# Patient Record
Sex: Male | Born: 1980 | Race: White | Hispanic: No | Marital: Single
Health system: Southern US, Community
[De-identification: ages and names within clinical notes are randomized; demographics above are authoritative.]

## PROBLEM LIST (undated history)

## (undated) DIAGNOSIS — M545 Low back pain, unspecified: Secondary | ICD-10-CM

## (undated) DIAGNOSIS — R519 Headache, unspecified: Secondary | ICD-10-CM

## (undated) DIAGNOSIS — R51 Headache: Secondary | ICD-10-CM

## (undated) HISTORY — DX: Morbid (severe) obesity due to excess calories: E66.01

---

## 2002-08-05 ENCOUNTER — Emergency Department (HOSPITAL_COMMUNITY): Admission: EM | Admit: 2002-08-05 | Discharge: 2002-08-05 | Payer: Self-pay | Admitting: Emergency Medicine

## 2002-08-09 ENCOUNTER — Encounter: Payer: Self-pay | Admitting: *Deleted

## 2002-08-09 ENCOUNTER — Emergency Department (HOSPITAL_COMMUNITY): Admission: EM | Admit: 2002-08-09 | Discharge: 2002-08-09 | Payer: Self-pay | Admitting: *Deleted

## 2007-08-08 ENCOUNTER — Emergency Department (HOSPITAL_COMMUNITY): Admission: EM | Admit: 2007-08-08 | Discharge: 2007-08-08 | Payer: Self-pay | Admitting: Emergency Medicine

## 2007-11-17 ENCOUNTER — Emergency Department (HOSPITAL_COMMUNITY): Admission: EM | Admit: 2007-11-17 | Discharge: 2007-11-17 | Payer: Self-pay | Admitting: Emergency Medicine

## 2007-11-27 ENCOUNTER — Emergency Department (HOSPITAL_COMMUNITY): Admission: EM | Admit: 2007-11-27 | Discharge: 2007-11-27 | Payer: Self-pay | Admitting: Emergency Medicine

## 2008-08-21 ENCOUNTER — Emergency Department (HOSPITAL_COMMUNITY): Admission: EM | Admit: 2008-08-21 | Discharge: 2008-08-22 | Payer: Self-pay | Admitting: Emergency Medicine

## 2011-03-21 ENCOUNTER — Emergency Department (HOSPITAL_COMMUNITY)
Admission: EM | Admit: 2011-03-21 | Discharge: 2011-03-22 | Disposition: A | Discharge status: Home / Self Care | Payer: Medicaid Other | Attending: Emergency Medicine | Admitting: Emergency Medicine

## 2011-03-21 DIAGNOSIS — Y929 Unspecified place or not applicable: Secondary | ICD-10-CM | POA: Insufficient documentation

## 2011-03-21 DIAGNOSIS — T63391A Toxic effect of venom of other spider, accidental (unintentional), initial encounter: Secondary | ICD-10-CM | POA: Insufficient documentation

## 2011-03-21 DIAGNOSIS — T6391XA Toxic effect of contact with unspecified venomous animal, accidental (unintentional), initial encounter: Secondary | ICD-10-CM | POA: Insufficient documentation

## 2011-03-21 DIAGNOSIS — M7989 Other specified soft tissue disorders: Secondary | ICD-10-CM | POA: Insufficient documentation

## 2011-03-21 DIAGNOSIS — M79609 Pain in unspecified limb: Secondary | ICD-10-CM | POA: Insufficient documentation

## 2011-12-02 ENCOUNTER — Emergency Department (HOSPITAL_COMMUNITY): Payer: Medicaid Other

## 2011-12-02 ENCOUNTER — Encounter: Payer: Self-pay | Admitting: Emergency Medicine

## 2011-12-02 ENCOUNTER — Emergency Department (HOSPITAL_COMMUNITY)
Admission: EM | Admit: 2011-12-02 | Discharge: 2011-12-02 | Disposition: A | Discharge status: Home / Self Care | Payer: Medicaid Other | Attending: Emergency Medicine | Admitting: Emergency Medicine

## 2011-12-02 DIAGNOSIS — W3189XA Contact with other specified machinery, initial encounter: Secondary | ICD-10-CM | POA: Insufficient documentation

## 2011-12-02 DIAGNOSIS — S81009A Unspecified open wound, unspecified knee, initial encounter: Secondary | ICD-10-CM | POA: Insufficient documentation

## 2011-12-02 DIAGNOSIS — S81011A Laceration without foreign body, right knee, initial encounter: Secondary | ICD-10-CM

## 2011-12-02 DIAGNOSIS — S81809A Unspecified open wound, unspecified lower leg, initial encounter: Secondary | ICD-10-CM | POA: Insufficient documentation

## 2011-12-02 DIAGNOSIS — F17200 Nicotine dependence, unspecified, uncomplicated: Secondary | ICD-10-CM | POA: Insufficient documentation

## 2011-12-02 MED ORDER — TETANUS-DIPHTH-ACELL PERTUSSIS 5-2.5-18.5 LF-MCG/0.5 IM SUSP
0.5000 mL | Freq: Once | INTRAMUSCULAR | Status: AC
  Administered 2011-12-02: 0.5 mL via INTRAMUSCULAR
  Filled 2011-12-02: qty 0.5

## 2011-12-02 MED ORDER — HYDROMORPHONE HCL PF 2 MG/ML IJ SOLN
2.0000 mg | Freq: Once | INTRAMUSCULAR | Status: AC
  Administered 2011-12-02: 2 mg via INTRAMUSCULAR
  Filled 2011-12-02: qty 1

## 2011-12-02 MED ORDER — IBUPROFEN 800 MG PO TABS: 800.0000 mg | ORAL_TABLET | Freq: Three times a day (TID) | ORAL | Status: AC

## 2011-12-02 MED ORDER — BACITRACIN 500 UNIT/GM EX OINT
1.0000 "application " | TOPICAL_OINTMENT | CUTANEOUS | Status: AC
  Administered 2011-12-02: 1 via TOPICAL

## 2011-12-02 NOTE — ED Notes (Signed)
Pt st's saw kicked back hitting his leg.  Pt has lac to right leg   Bleeding controlled at this time

## 2011-12-02 NOTE — ED Provider Notes (Signed)
History     CSN: [id number] Arrival date & time: 12/02/2011  4:15 PM   First MD Initiated Contact with Patient 12/02/11 1652      Chief Complaint  Patient presents with  . Extremity Laceration    (Consider location/radiation/quality/duration/timing/severity/associated sxs/prior treatment) HPI Comments: Patient reports he was using an electric saw to cut stone when the saw slipped and cut his right knee.  Reports pain at the site of the laceration that is moderate and aching in nature.  Denies other injury.  Denies numbness of his leg or difficulty ambulating or moving the knee.  Last tetanus vx is unknown.    The history is provided by the patient.    History reviewed. No pertinent past medical history.  History reviewed. No pertinent past surgical history.  No family history on file.  History  Substance Use Topics  . Smoking status: Current Everyday Smoker  . Smokeless tobacco: Not on file  . Alcohol Use: No      Review of Systems  Allergies  Review of patient's allergies indicates no known allergies.  Home Medications  No current outpatient prescriptions on file.  BP 113/71  Pulse 80  Temp(Src) 98.2 F (36.8 C) (Oral)  Resp 20  SpO2 98%  Physical Exam  Nursing note and vitals reviewed. Constitutional: He is oriented to person, place, and time. He appears well-developed and well-nourished.  HENT:  Head: Normocephalic and atraumatic.  Neck: Neck supple.  Pulmonary/Chest: Effort normal.  Musculoskeletal: Normal range of motion.       Full AROM of right knee.  Deep laceration to superior, medial right knee.  No FB seen or palpated.  Distal pulses intact.  Sensation intact.    Neurological: He is alert and oriented to person, place, and time.    ED Course  Procedures (including critical care time)  LACERATION REPAIR Performed by: Rise Patience Consent: Verbal consent obtained. Risks and benefits: risks, benefits and alternatives were discussed Patient  identity confirmed: provided demographic data Time out performed prior to procedure Prepped and Draped in normal sterile fashion Wound explored  Laceration Location: right knee  Laceration Length: 7cm  No Foreign Bodies seen or palpated  Anesthesia: local infiltration  Local anesthetic: lidocaine 2% no epinephrine  Anesthetic total: 8 ml  Irrigation method: syringe Amount of cleaning: standard  Skin closure: chromic 4-0 and prolene 4-0  Number of sutures or staples: 12  Technique: deep running suture (1) and simple interrupted over skin (11)  Patient tolerance: Patient tolerated the procedure well with no immediate complications.   Labs Reviewed - No data to display Dg Knee 2 Views Right  12/02/2011  *RADIOLOGY REPORT*  Clinical Data: Laceration by electric saw, evaluate for foreign body  RIGHT KNEE - 1-2 VIEW  Comparison: None.  Findings: No fracture or dislocation is seen.  The joint spaces are preserved.  Soft tissue laceration along the medial aspect of the knee.  No radiopaque foreign body is seen.  IMPRESSION: Soft tissue laceration along the medial aspect of the knee.  No fracture, dislocation, or radiopaque foreign body is seen.  Original Report Authenticated By: Charline Bills, M.D.     1. Laceration of right knee       MDM  Patient with laceration of right knee with normal xray, no tendon damage, no FB.  Closed in ED.  Instructions given for follow up and reasons for immediate return.  Patient verbalizes understanding.          Rise Patience,  PA 12/02/11 2029

## 2011-12-03 NOTE — ED Provider Notes (Signed)
Medical screening examination/treatment/procedure(s) were performed by non-physician practitioner and as supervising physician I was immediately available for consultation/collaboration.  Haydyn Girvan T Brayant Dorr, MD 12/03/11 1603 

## 2012-12-27 ENCOUNTER — Emergency Department (HOSPITAL_COMMUNITY)
Admission: EM | Admit: 2012-12-27 | Discharge: 2012-12-28 | Disposition: A | Discharge status: Home / Self Care | Payer: Medicaid Other | Attending: Emergency Medicine | Admitting: Emergency Medicine

## 2012-12-27 ENCOUNTER — Encounter (HOSPITAL_COMMUNITY): Payer: Self-pay | Admitting: Emergency Medicine

## 2012-12-27 DIAGNOSIS — Y929 Unspecified place or not applicable: Secondary | ICD-10-CM | POA: Insufficient documentation

## 2012-12-27 DIAGNOSIS — F17200 Nicotine dependence, unspecified, uncomplicated: Secondary | ICD-10-CM | POA: Insufficient documentation

## 2012-12-27 DIAGNOSIS — Y9389 Activity, other specified: Secondary | ICD-10-CM | POA: Insufficient documentation

## 2012-12-27 DIAGNOSIS — IMO0002 Reserved for concepts with insufficient information to code with codable children: Secondary | ICD-10-CM | POA: Insufficient documentation

## 2012-12-27 DIAGNOSIS — M545 Low back pain: Secondary | ICD-10-CM

## 2012-12-27 DIAGNOSIS — X500XXA Overexertion from strenuous movement or load, initial encounter: Secondary | ICD-10-CM | POA: Insufficient documentation

## 2012-12-27 DIAGNOSIS — T148XXA Other injury of unspecified body region, initial encounter: Secondary | ICD-10-CM

## 2012-12-27 NOTE — ED Notes (Signed)
Patient c/o lower back pain since 1630.  States was picking up a bucket of rock at work and thinks he may have twisted wrong.

## 2012-12-28 MED ORDER — KETOROLAC TROMETHAMINE 10 MG PO TABS
10.0000 mg | ORAL_TABLET | Freq: Once | ORAL | Status: AC
  Administered 2012-12-28: 10 mg via ORAL
  Filled 2012-12-28: qty 1

## 2012-12-28 MED ORDER — HYDROMORPHONE HCL PF 1 MG/ML IJ SOLN
1.0000 mg | Freq: Once | INTRAMUSCULAR | Status: AC
  Administered 2012-12-28: 1 mg via INTRAMUSCULAR
  Filled 2012-12-28: qty 1

## 2012-12-28 MED ORDER — DIAZEPAM 5 MG PO TABS
5.0000 mg | ORAL_TABLET | Freq: Once | ORAL | Status: AC
  Administered 2012-12-28: 5 mg via ORAL
  Filled 2012-12-28: qty 1

## 2012-12-28 MED ORDER — ONDANSETRON 4 MG PO TBDP
4.0000 mg | ORAL_TABLET | Freq: Once | ORAL | Status: AC
  Administered 2012-12-28: 4 mg via ORAL
  Filled 2012-12-28: qty 1

## 2012-12-28 MED ORDER — HYDROCODONE-ACETAMINOPHEN 7.5-325 MG PO TABS: 1.0000 | ORAL_TABLET | ORAL | Status: AC | PRN

## 2012-12-28 MED ORDER — MELOXICAM 7.5 MG PO TABS: ORAL_TABLET | ORAL | Status: DC

## 2012-12-28 MED ORDER — METHOCARBAMOL 500 MG PO TABS: ORAL_TABLET | ORAL | Status: DC

## 2012-12-28 NOTE — ED Provider Notes (Signed)
Medical screening examination/treatment/procedure(s) were performed by non-physician practitioner and as supervising physician I was immediately available for consultation/collaboration.  Nicoletta Dress. Colon Branch, MD 12/28/12 [phone]

## 2012-12-28 NOTE — ED Provider Notes (Signed)
History     CSN: [id number]  Arrival date & time 12/27/12  2310   First MD Initiated Contact with Patient 12/27/12 2318      Chief Complaint  Patient presents with  . Back Pain    (Consider location/radiation/quality/duration/timing/severity/associated sxs/prior treatment) Patient is a 32 y.o. male presenting with back pain. The history is provided by the patient.  Back Pain  This is a new problem. The current episode started 6 to 12 hours ago. The problem occurs constantly. The problem has been gradually worsening. The pain is associated with lifting heavy objects and twisting. The pain is present in the lumbar spine. The quality of the pain is described as aching (back spasms). The pain is at a severity of 10/10. The symptoms are aggravated by twisting and certain positions. The pain is the same all the time. Pertinent negatives include no chest pain, no fever, no numbness, no abdominal pain, no bowel incontinence, no perianal numbness, no bladder incontinence and no dysuria. He has tried nothing for the symptoms.    History reviewed. No pertinent past medical history.  History reviewed. No pertinent past surgical history.  No family history on file.  History  Substance Use Topics  . Smoking status: Current Every Day Smoker  . Smokeless tobacco: Not on file  . Alcohol Use: Yes     Comment: occ      Review of Systems  Constitutional: Negative for fever and activity change.       All ROS Neg except as noted in HPI  HENT: Negative for nosebleeds and neck pain.   Eyes: Negative for photophobia and discharge.  Respiratory: Positive for cough. Negative for shortness of breath and wheezing.   Cardiovascular: Negative for chest pain and palpitations.  Gastrointestinal: Negative for abdominal pain, blood in stool and bowel incontinence.  Genitourinary: Negative for bladder incontinence, dysuria, frequency and hematuria.  Musculoskeletal: Positive for back pain. Negative for  arthralgias.  Skin: Negative.   Neurological: Negative for dizziness, seizures, speech difficulty and numbness.  Psychiatric/Behavioral: Negative for hallucinations and confusion.    Allergies  Review of patient's allergies indicates no known allergies.  Home Medications  No current outpatient prescriptions on file.  BP 122/76  Pulse 70  Temp 97.5 F (36.4 C) (Oral)  Resp 18  Ht 6' (1.829 m)  Wt 290 lb (131.543 kg)  BMI 39.33 kg/m2  SpO2 99%  Physical Exam  Nursing note and vitals reviewed. Constitutional: He is oriented to person, place, and time. He appears well-developed and well-nourished.  Non-toxic appearance.  HENT:  Head: Normocephalic.  Right Ear: Tympanic membrane and external ear normal.  Left Ear: Tympanic membrane and external ear normal.  Eyes: EOM and lids are normal. Pupils are equal, round, and reactive to light.  Neck: Normal range of motion. Neck supple. Carotid bruit is not present.  Cardiovascular: Normal rate, regular rhythm, normal heart sounds, intact distal pulses and normal pulses.   Pulmonary/Chest: Breath sounds normal. No respiratory distress.       Few scattered rhonchi and wheezes.  Abdominal: Soft. Bowel sounds are normal. There is no tenderness. There is no guarding.  Musculoskeletal: Normal range of motion.       Right and left paraspinal pain/spasm in the lumbar area to palpation and change of position.  Lymphadenopathy:       Head (right side): No submandibular adenopathy present.       Head (left side): No submandibular adenopathy present.    He has no cervical  adenopathy.  Neurological: He is alert and oriented to person, place, and time. He has normal strength. No cranial nerve deficit or sensory deficit. He exhibits normal muscle tone. Coordination normal.  Skin: Skin is warm and dry.  Psychiatric: He has a normal mood and affect. His speech is normal.    ED Course  Procedures (including critical care time)  Labs Reviewed - No  data to display No results found. Pulse Ox 99% on room air. WNL by my interpretation.  No diagnosis found.    MDM  I have reviewed nursing notes, vital signs, and all appropriate lab and imaging results for this patient. Pt was lifting bucks of concrete and rocks today, twisted  And lifted wrong and has had pain of the lower back since that time. No gross neuro deficit. Rx for norco 7.5, robaxin, and mobic given to the patient. He is to use heat to the lower back and see Dr Sherlean Foot for evaluation if not improving.       Kathie Dike, [place] 12/28/12 [phone]

## 2013-04-06 ENCOUNTER — Emergency Department (HOSPITAL_COMMUNITY)
Admission: EM | Admit: 2013-04-06 | Discharge: 2013-04-06 | Disposition: A | Discharge status: Home / Self Care | Payer: Medicaid Other | Attending: Emergency Medicine | Admitting: Emergency Medicine

## 2013-04-06 ENCOUNTER — Encounter (HOSPITAL_COMMUNITY): Payer: Self-pay | Admitting: *Deleted

## 2013-04-06 DIAGNOSIS — X503XXA Overexertion from repetitive movements, initial encounter: Secondary | ICD-10-CM | POA: Insufficient documentation

## 2013-04-06 DIAGNOSIS — F17200 Nicotine dependence, unspecified, uncomplicated: Secondary | ICD-10-CM | POA: Insufficient documentation

## 2013-04-06 DIAGNOSIS — M62830 Muscle spasm of back: Secondary | ICD-10-CM

## 2013-04-06 DIAGNOSIS — M5380 Other specified dorsopathies, site unspecified: Secondary | ICD-10-CM | POA: Insufficient documentation

## 2013-04-06 DIAGNOSIS — Y9389 Activity, other specified: Secondary | ICD-10-CM | POA: Insufficient documentation

## 2013-04-06 DIAGNOSIS — M545 Low back pain: Secondary | ICD-10-CM

## 2013-04-06 DIAGNOSIS — Y990 Civilian activity done for income or pay: Secondary | ICD-10-CM | POA: Insufficient documentation

## 2013-04-06 DIAGNOSIS — IMO0002 Reserved for concepts with insufficient information to code with codable children: Secondary | ICD-10-CM | POA: Insufficient documentation

## 2013-04-06 DIAGNOSIS — Y9289 Other specified places as the place of occurrence of the external cause: Secondary | ICD-10-CM | POA: Insufficient documentation

## 2013-04-06 MED ORDER — DIAZEPAM 5 MG/ML IJ SOLN
10.0000 mg | Freq: Once | INTRAMUSCULAR | Status: AC
  Administered 2013-04-06: 10 mg via INTRAVENOUS
  Filled 2013-04-06: qty 2

## 2013-04-06 MED ORDER — DEXAMETHASONE SODIUM PHOSPHATE 4 MG/ML IJ SOLN
10.0000 mg | Freq: Once | INTRAMUSCULAR | Status: AC
  Administered 2013-04-06: 10 mg via INTRAVENOUS
  Filled 2013-04-06: qty 1

## 2013-04-06 MED ORDER — MORPHINE SULFATE 4 MG/ML IJ SOLN
4.0000 mg | Freq: Once | INTRAMUSCULAR | Status: AC
  Administered 2013-04-06: 4 mg via INTRAMUSCULAR
  Filled 2013-04-06: qty 1

## 2013-04-06 MED ORDER — IBUPROFEN 600 MG PO TABS: 600.0000 mg | ORAL_TABLET | Freq: Four times a day (QID) | ORAL | Status: DC | PRN

## 2013-04-06 MED ORDER — CYCLOBENZAPRINE HCL 5 MG PO TABS: 5.0000 mg | ORAL_TABLET | Freq: Three times a day (TID) | ORAL | Status: DC | PRN

## 2013-04-06 MED ORDER — TRAMADOL HCL 50 MG PO TABS: 100.0000 mg | ORAL_TABLET | Freq: Four times a day (QID) | ORAL | Status: DC | PRN

## 2013-04-06 NOTE — ED Provider Notes (Signed)
History    This chart was scribed for Ward Givens, MD by Marlyne Beards, ED Scribe. The patient was seen in room APA07/[facility]07. Patient's care was started at 9:58 PM.    CSN: [id number]  Arrival date & time 04/06/13  1908   First MD Initiated Contact with Patient 04/06/13 2116      Chief Complaint  Patient presents with  . Back Pain    (Consider location/radiation/quality/duration/timing/severity/associated sxs/prior treatment) HPI Cory [name] is a 32 y.o. male who presents to the Emergency Department complaining of moderate constant back pain onset yesterday. Pt was brought to the ED by his girlfriend. Pt states he lifted an 85 lb bag of rock resulting in his back twisting when he lifted it up to put onto his right shoulder late yesterday afternnoon . Pt shortly after hit the ground due to excruciating pain. Pt states that pain is exacerbated upon sitting, standing, and lying down. He states the location is in the center of his back on both sides. Pt denies bladder/bowel incontinence, numbness in extremities, fever, chills, cough, nausea, vomiting, diarrhea, SOB, weakness, and any other associated symptoms. Pt has been seen once before in January for LBP.   PCP none  History reviewed. No pertinent past medical history.  History reviewed. No pertinent past surgical history.  History reviewed. No pertinent family history.  History  Substance Use Topics  . Smoking status: Current Every Day Smoker  . Smokeless tobacco: Not on file  . Alcohol Use: Yes     Comment: occ   employed   Review of Systems  Musculoskeletal: Positive for back pain.  All other systems reviewed and are negative.    Allergies  Review of patient's allergies indicates no known allergies.  Home Medications   none  BP 122/59  Pulse 82  Temp(Src) 98.2 F (36.8 C) (Oral)  Resp 18  Ht 6' (1.829 m)  Wt 285 lb (129.275 kg)  BMI 38.64 kg/m2  SpO2 100%  Vital signs normal    Physical Exam  Nursing  note and vitals reviewed. Constitutional: He is oriented to person, place, and time. He appears well-developed and well-nourished. He appears distressed.  Appears uncomfortable, obese  HENT:  Head: Normocephalic and atraumatic.  Right Ear: External ear normal.  Left Ear: External ear normal.  Eyes: Conjunctivae and EOM are normal. Pupils are equal, round, and reactive to light.  Neck: Normal range of motion. Neck supple.  Pulmonary/Chest: Effort normal and breath sounds normal.  Musculoskeletal:       Lumbar back: He exhibits decreased range of motion, tenderness, bony tenderness, pain and spasm. He exhibits no swelling, no edema and no deformity.  Lower thoracic spine and diffuse lumbar spine tenderness upon palpation. Tender in the para spinal regions bilaterally upon palpation. Intact patellar reflexes. Pain on minimal range of motion in his waist in all directions. Straight leg rasing causes pressure in his lower back. Patellar reflexes +2 and = bilaterally  Neurological: He is alert and oriented to person, place, and time.  Skin: Skin is warm, dry and intact. He is not diaphoretic. No erythema. No pallor.    ED Course  Procedures (including critical care time) Medications  dexamethasone (DECADRON) injection 10 mg (10 mg Intravenous Given 04/06/13 2157)  morphine 4 MG/ML injection 4 mg (4 mg Intramuscular Given 04/06/13 2157)  diazepam (VALIUM) injection 10 mg (10 mg Intravenous Given 04/06/13 2157)     1. Acute low back pain   2. Muscle spasm of back  New Prescriptions   CYCLOBENZAPRINE (FLEXERIL) 5 MG TABLET    Take 1 tablet (5 mg total) by mouth 3 (three) times daily as needed for muscle spasms.   IBUPROFEN (ADVIL,MOTRIN) 600 MG TABLET    Take 1 tablet (600 mg total) by mouth every 6 (six) hours as needed for pain.   TRAMADOL (ULTRAM) 50 MG TABLET    Take 2 tablets (100 mg total) by mouth every 6 (six) hours as needed for pain.    Plan discharge  Devoria Albe, MD,  FACEP    MDM   I personally performed the services described in this documentation, which was scribed in my presence. The recorded information has been reviewed and considered.  Devoria Albe, MD, Armando Gang         Ward Givens, MD 04/06/13 2206

## 2013-04-06 NOTE — ED Notes (Signed)
Twisted back at work yesterday, pain since then

## 2013-06-15 ENCOUNTER — Emergency Department (HOSPITAL_COMMUNITY)
Admission: EM | Admit: 2013-06-15 | Discharge: 2013-06-15 | Disposition: A | Discharge status: Home / Self Care | Payer: Medicaid Other | Attending: Emergency Medicine | Admitting: Emergency Medicine

## 2013-06-15 ENCOUNTER — Encounter (HOSPITAL_COMMUNITY): Payer: Self-pay

## 2013-06-15 DIAGNOSIS — R21 Rash and other nonspecific skin eruption: Secondary | ICD-10-CM | POA: Insufficient documentation

## 2013-06-15 DIAGNOSIS — T622X1A Toxic effect of other ingested (parts of) plant(s), accidental (unintentional), initial encounter: Secondary | ICD-10-CM | POA: Insufficient documentation

## 2013-06-15 DIAGNOSIS — H05229 Edema of unspecified orbit: Secondary | ICD-10-CM | POA: Insufficient documentation

## 2013-06-15 DIAGNOSIS — F17200 Nicotine dependence, unspecified, uncomplicated: Secondary | ICD-10-CM | POA: Insufficient documentation

## 2013-06-15 DIAGNOSIS — L259 Unspecified contact dermatitis, unspecified cause: Secondary | ICD-10-CM

## 2013-06-15 DIAGNOSIS — L255 Unspecified contact dermatitis due to plants, except food: Secondary | ICD-10-CM | POA: Insufficient documentation

## 2013-06-15 DIAGNOSIS — Y929 Unspecified place or not applicable: Secondary | ICD-10-CM | POA: Insufficient documentation

## 2013-06-15 DIAGNOSIS — Z79899 Other long term (current) drug therapy: Secondary | ICD-10-CM | POA: Insufficient documentation

## 2013-06-15 DIAGNOSIS — Y939 Activity, unspecified: Secondary | ICD-10-CM | POA: Insufficient documentation

## 2013-06-15 DIAGNOSIS — R600 Localized edema: Secondary | ICD-10-CM

## 2013-06-15 MED ORDER — DEXAMETHASONE SODIUM PHOSPHATE 10 MG/ML IJ SOLN
10.0000 mg | Freq: Once | INTRAMUSCULAR | Status: AC
  Administered 2013-06-15: 10 mg via INTRAVENOUS
  Filled 2013-06-15: qty 1

## 2013-06-15 MED ORDER — MORPHINE SULFATE 4 MG/ML IJ SOLN
4.0000 mg | Freq: Once | INTRAMUSCULAR | Status: AC
  Administered 2013-06-15: 4 mg via INTRAVENOUS
  Filled 2013-06-15: qty 1

## 2013-06-15 MED ORDER — PREDNISONE 10 MG PO TABS: ORAL_TABLET | ORAL | Status: DC

## 2013-06-15 MED ORDER — DIPHENHYDRAMINE HCL 50 MG/ML IJ SOLN
50.0000 mg | Freq: Once | INTRAMUSCULAR | Status: AC
  Administered 2013-06-15: 50 mg via INTRAVENOUS
  Filled 2013-06-15: qty 1

## 2013-06-15 MED ORDER — DIPHENHYDRAMINE HCL 25 MG PO TABS: 50.0000 mg | ORAL_TABLET | ORAL | Status: DC | PRN

## 2013-06-15 NOTE — ED Notes (Signed)
Pt alert & oriented x4, stable gait. Patient given discharge instructions, paperwork & prescription(s). Patient  instructed to stop at the registration desk to finish any additional paperwork. Patient verbalized understanding. Pt left department w/ no further questions. 

## 2013-06-15 NOTE — ED Provider Notes (Signed)
History    CSN: [id number] Arrival date & time 06/15/13  0242  First MD Initiated Contact with Patient 06/15/13 0300     Chief Complaint  Patient presents with  . Poison Ivy  . Facial Swelling    Patient is a 32 y.o. male presenting with rash. The history is provided by the patient.  Rash Pain location: face/arms. Pain severity:  Mild Duration:  2 days Timing:  Constant Progression:  Worsening Chronicity:  New Relieved by:  Nothing Worsened by:  Nothing tried Ineffective treatments:  OTC medications Associated symptoms: no chest pain, no fever and no shortness of breath    Pt reports "walking in the woods" several days ago, and then for past two days has had rash to his arms, and now has rash to his face and around his left eye.  He thinks it is poison ivy No other medical problems No new meds or other exposure No facial/tongue swelling No cp/sob No visual loss or eye pain.  He reports pain in face from the swelling    PMH - none   History  Substance Use Topics  . Smoking status: Current Every Day Smoker  . Smokeless tobacco: Not on file  . Alcohol Use: Yes     Comment: occ    Review of Systems  Constitutional: Negative for fever.  Eyes: Negative for redness and visual disturbance.  Respiratory: Negative for shortness of breath.   Cardiovascular: Negative for chest pain.  Skin: Positive for rash.  Allergic/Immunologic: Negative for immunocompromised state.  All other systems reviewed and are negative.    Allergies  Ultram  Home Medications   Current Outpatient Rx  Name  Route  Sig  Dispense  Refill  . ibuprofen (ADVIL,MOTRIN) 600 MG tablet   Oral   Take 1 tablet (600 mg total) by mouth every 6 (six) hours as needed for pain.   60 tablet   0   . cyclobenzaprine (FLEXERIL) 5 MG tablet   Oral   Take 1 tablet (5 mg total) by mouth 3 (three) times daily as needed for muscle spasms.   30 tablet   0   . meloxicam (MOBIC) 7.5 MG tablet      1 po  bid with food   12 tablet   0   . methocarbamol (ROBAXIN) 500 MG tablet      2 po tid for spasm/pain   30 tablet   0   . traMADol (ULTRAM) 50 MG tablet   Oral   Take 2 tablets (100 mg total) by mouth every 6 (six) hours as needed for pain.   16 tablet   0    BP 122/63  Pulse 94  Temp(Src) 98 F (36.7 C) (Oral)  Resp 20  Ht 6' (1.829 m)  Wt 260 lb (117.935 kg)  BMI 35.25 kg/m2  SpO2 98% Physical Exam CONSTITUTIONAL: Well developed/well nourished HEAD: Normocephalic/atraumatic EYES: EOMI/PERRL, left periorbital edema with mild erythema.  The eye is nonerythematous, no foreign body or haziness noted and full EOM is intact.  No proptosis.  No bony crepitance or bruising.   ENMT: Mucous membranes moist. Uvula midline.  No stridor.  No tongue or lip swelling NECK: supple no meningeal signs SPINE:entire spine nontender CV: S1/S2 noted, no murmurs/rubs/gallops noted LUNGS: Lungs are clear to auscultation bilaterally, no apparent distress ABDOMEN: soft, nontender, no rebound or guarding NEURO: Pt is awake/alert, moves all extremitiesx4 EXTREMITIES: pulses normal, full ROM Rash to both of his forearms c/w contact  dermatitis SKIN: warm, color normal PSYCH: no abnormalities of mood noted  ED Course  Procedures  MDM  Nursing notes including past medical history and social history reviewed and considered in documentation  Due facial involvement, will need up to two week course of prednisone Pt understands this He is otherwise well appearing and stable for d/c.  He denies visual changes to suggest any orbital involvement or orbital cellulitis  Joya Gaskins, MD 06/15/13 [phone]

## 2013-06-15 NOTE — ED Notes (Signed)
Allergic reaction to poison ivy, having facial swelling per pt. Have tried to deal with it at home and it's not working.

## 2013-06-15 NOTE — ED Notes (Signed)
MD at bedside. 

## 2013-11-16 ENCOUNTER — Emergency Department (HOSPITAL_COMMUNITY)
Admission: EM | Admit: 2013-11-16 | Discharge: 2013-11-16 | Disposition: A | Discharge status: Home / Self Care | Payer: Medicaid Other | Attending: Emergency Medicine | Admitting: Emergency Medicine

## 2013-11-16 ENCOUNTER — Encounter (HOSPITAL_COMMUNITY): Payer: Self-pay | Admitting: Emergency Medicine

## 2013-11-16 ENCOUNTER — Emergency Department (HOSPITAL_COMMUNITY): Payer: Medicaid Other

## 2013-11-16 DIAGNOSIS — F17200 Nicotine dependence, unspecified, uncomplicated: Secondary | ICD-10-CM | POA: Insufficient documentation

## 2013-11-16 DIAGNOSIS — J029 Acute pharyngitis, unspecified: Secondary | ICD-10-CM | POA: Insufficient documentation

## 2013-11-16 DIAGNOSIS — J329 Chronic sinusitis, unspecified: Secondary | ICD-10-CM | POA: Insufficient documentation

## 2013-11-16 MED ORDER — AMOXICILLIN 500 MG PO CAPS: 500.0000 mg | ORAL_CAPSULE | Freq: Three times a day (TID) | ORAL | Status: DC

## 2013-11-16 MED ORDER — HYDROCOD POLST-CHLORPHEN POLST 10-8 MG/5ML PO LQCR
5.0000 mL | Freq: Once | ORAL | Status: AC
  Administered 2013-11-16: 5 mL via ORAL
  Filled 2013-11-16: qty 5

## 2013-11-16 MED ORDER — HYDROCOD POLST-CHLORPHEN POLST 10-8 MG/5ML PO LQCR: 5.0000 mL | Freq: Two times a day (BID) | ORAL | Status: DC

## 2013-11-16 MED ORDER — FEXOFENADINE-PSEUDOEPHED ER 60-120 MG PO TB12: 1.0000 | ORAL_TABLET | Freq: Two times a day (BID) | ORAL | Status: DC

## 2013-11-16 NOTE — ED Notes (Signed)
Patient given discharge instruction, verbalized understand. Patient ambulatory out of the department.  

## 2013-11-16 NOTE — ED Notes (Signed)
Pt was sleeping when called to go to tx room

## 2013-11-16 NOTE — ED Notes (Signed)
Body aches, cough , nasal congestion. No fever, diarrhea 2 days ago

## 2013-11-20 NOTE — ED Provider Notes (Signed)
CSN: [id number]     Arrival date & time 11/16/13  1724 History   First MD Initiated Contact with Patient 11/16/13 1807     Chief Complaint  Patient presents with  . Cough   (Consider location/radiation/quality/duration/timing/severity/associated sxs/prior Treatment) HPI Comments: Cory [name] is a 32 y.o. Male presenting with a 1 week history of uri type symptoms which includes nasal congestion with thick, sometimes blood streaked discharge, sinus pressure, sore throat, low grade fever, myalgias and nonproductive cough.  Symptoms due to not include shortness of breath, chest pain,  Nausea, vomiting or diarrhea although states he had diarrhea 2 days ago.  The patient has taken tylenol and ibuprofen  with no significant improvement in symptoms.      The history is provided by the patient.    History reviewed. No pertinent past medical history. History reviewed. No pertinent past surgical history. History reviewed. No pertinent family history. History  Substance Use Topics  . Smoking status: Current Every Day Smoker    Types: Cigarettes  . Smokeless tobacco: Not on file  . Alcohol Use: Yes     Comment: occ    Review of Systems  Constitutional: Positive for fever and chills.  HENT: Positive for congestion, rhinorrhea, sinus pressure and sore throat. Negative for ear pain, mouth sores, trouble swallowing and voice change.   Eyes: Negative for discharge.  Respiratory: Positive for cough. Negative for shortness of breath, wheezing and stridor.   Cardiovascular: Negative for chest pain.  Gastrointestinal: Negative for abdominal pain.  Genitourinary: Negative.     Allergies  Ultram  Home Medications   Current Outpatient Rx  Name  Route  Sig  Dispense  Refill  . acetaminophen (TYLENOL) 500 MG tablet   Oral   Take 1,000-2,000 mg by mouth every 6 (six) hours as needed.         Marland Kitchen ibuprofen (ADVIL,MOTRIN) 200 MG tablet   Oral   Take 800 mg by mouth every 6 (six) hours as  needed.         Marland Kitchen amoxicillin (AMOXIL) 500 MG capsule   Oral   Take 1 capsule (500 mg total) by mouth 3 (three) times daily.   30 capsule   0   . chlorpheniramine-HYDROcodone (TUSSIONEX PENNKINETIC ER) 10-8 MG/5ML LQCR   Oral   Take 5 mLs by mouth 2 (two) times daily.   75 mL   0   . fexofenadine-pseudoephedrine (ALLEGRA-D) 60-120 MG per tablet   Oral   Take 1 tablet by mouth every 12 (twelve) hours.   20 tablet   0    BP 116/59  Pulse 79  Temp(Src) 98.2 F (36.8 C) (Oral)  Resp 20  Ht 6' (1.829 m)  Wt 280 lb (127.007 kg)  BMI 37.97 kg/m2  SpO2 98% Physical Exam  Constitutional: He is oriented to person, place, and time. He appears well-developed and well-nourished.  HENT:  Head: Normocephalic and atraumatic.  Right Ear: Tympanic membrane and ear canal normal.  Left Ear: Tympanic membrane and ear canal normal.  Nose: Mucosal edema and rhinorrhea present. Right sinus exhibits maxillary sinus tenderness. Left sinus exhibits maxillary sinus tenderness.  Mouth/Throat: Uvula is midline, oropharynx is clear and moist and mucous membranes are normal. No oropharyngeal exudate, posterior oropharyngeal edema, posterior oropharyngeal erythema or tonsillar abscesses.  Eyes: Conjunctivae are normal.  Cardiovascular: Normal rate and normal heart sounds.   Pulmonary/Chest: Effort normal. No respiratory distress. He has no wheezes. He has no rales.  Abdominal: Soft. There is  no tenderness.  Musculoskeletal: Normal range of motion.  Neurological: He is alert and oriented to person, place, and time.  Skin: Skin is warm and dry. No rash noted.  Psychiatric: He has a normal mood and affect.    ED Course  Procedures (including critical care time) Labs Review Labs Reviewed - No data to display Imaging Review No results found.  EKG Interpretation   None       MDM   1. Sinusitis    Amoxil, allegra d, tussionex,  Encouraged warm compresses/steam, menthol drops.  Recheck for  any worsened or persistent pain, fever.      Burgess Amor, PA-C 11/20/13 [phone]

## 2013-11-26 NOTE — ED Provider Notes (Signed)
Medical screening examination/treatment/procedure(s) were performed by non-physician practitioner and as supervising physician I was immediately available for consultation/collaboration.  EKG Interpretation   None         Benny Lennert, MD 11/26/13 1558

## 2014-05-28 ENCOUNTER — Emergency Department (HOSPITAL_COMMUNITY)
Admission: EM | Admit: 2014-05-28 | Discharge: 2014-05-28 | Disposition: A | Discharge status: Home / Self Care | Payer: Medicaid Other | Attending: Emergency Medicine | Admitting: Emergency Medicine

## 2014-05-28 ENCOUNTER — Encounter (HOSPITAL_COMMUNITY): Payer: Self-pay | Admitting: Emergency Medicine

## 2014-05-28 DIAGNOSIS — Z79899 Other long term (current) drug therapy: Secondary | ICD-10-CM | POA: Insufficient documentation

## 2014-05-28 DIAGNOSIS — K047 Periapical abscess without sinus: Secondary | ICD-10-CM

## 2014-05-28 DIAGNOSIS — K044 Acute apical periodontitis of pulpal origin: Secondary | ICD-10-CM | POA: Insufficient documentation

## 2014-05-28 DIAGNOSIS — F17200 Nicotine dependence, unspecified, uncomplicated: Secondary | ICD-10-CM | POA: Insufficient documentation

## 2014-05-28 DIAGNOSIS — Z791 Long term (current) use of non-steroidal anti-inflammatories (NSAID): Secondary | ICD-10-CM | POA: Insufficient documentation

## 2014-05-28 DIAGNOSIS — K029 Dental caries, unspecified: Secondary | ICD-10-CM

## 2014-05-28 MED ORDER — AMOXICILLIN 250 MG PO CAPS
500.0000 mg | ORAL_CAPSULE | Freq: Once | ORAL | Status: AC
Start: 2014-05-28 — End: 2014-05-28
  Administered 2014-05-28: 500 mg via ORAL
  Filled 2014-05-28: qty 2

## 2014-05-28 MED ORDER — AMOXICILLIN 500 MG PO CAPS
500.0000 mg | ORAL_CAPSULE | Freq: Three times a day (TID) | ORAL | Status: AC
Start: 2014-05-28 — End: 2014-06-07

## 2014-05-28 MED ORDER — HYDROCODONE-ACETAMINOPHEN 5-325 MG PO TABS: 1.0000 | ORAL_TABLET | ORAL | Status: DC | PRN

## 2014-05-28 MED ORDER — HYDROCODONE-ACETAMINOPHEN 5-325 MG PO TABS
1.0000 | ORAL_TABLET | Freq: Once | ORAL | Status: AC
  Administered 2014-05-28: 1 via ORAL
  Filled 2014-05-28: qty 1

## 2014-05-28 NOTE — ED Provider Notes (Signed)
CSN: [id number]     Arrival date & time 05/28/14  2022 History   First MD Initiated Contact with Patient 05/28/14 2105     Chief Complaint  Patient presents with  . Dental Pain     (Consider location/radiation/quality/duration/timing/severity/associated sxs/prior Treatment) Patient is a 33 y.o. male presenting with tooth pain.  Dental Pain Associated symptoms: no facial swelling, no fever and no neck pain     Cory [name] is a 33 y.o. male presenting with a 1 week history of dental pain and gingival swelling.   The patient has a history of  decay in multiple teeth involved which has recently started to cause increased  Pain along with swelling and pain along his lower anterior and right gingiva.  There has been no fevers, chills, nausea or vomiting, also no complaint of difficulty swallowing, although chewing makes pain worse.  The patient has tried oragel and tylenol without relief of symptoms.  He has set up an appointment with Dr. Gaspar GarbeLayfayette Jenkins next week to establish dental care.    History reviewed. No pertinent past medical history. History reviewed. No pertinent past surgical history. No family history on file. History  Substance Use Topics  . Smoking status: Current Every Day Smoker    Types: Cigarettes  . Smokeless tobacco: Not on file  . Alcohol Use: Yes     Comment: occ    Review of Systems  Constitutional: Negative for fever.  HENT: Positive for dental problem. Negative for facial swelling and sore throat.   Respiratory: Negative for shortness of breath.   Musculoskeletal: Negative for neck pain and neck stiffness.      Allergies  Ultram  Home Medications   Prior to Admission medications   Medication Sig Start Date End Date Taking? Authorizing Provider  acetaminophen (TYLENOL) 500 MG tablet Take 1,000-2,000 mg by mouth every 6 (six) hours as needed.    Historical Provider, MD  amoxicillin (AMOXIL) 500 MG capsule Take 1 capsule (500 mg total) by mouth  3 (three) times daily. 11/16/13   Burgess AmorJulie Eugena Rhue, PA-C  amoxicillin (AMOXIL) 500 MG capsule Take 1 capsule (500 mg total) by mouth 3 (three) times daily. 05/28/14 06/07/14  Burgess AmorJulie Rubel Heckard, PA-C  chlorpheniramine-HYDROcodone (TUSSIONEX PENNKINETIC ER) 10-8 MG/5ML LQCR Take 5 mLs by mouth 2 (two) times daily. 11/16/13   Burgess AmorJulie Marketa Midkiff, PA-C  fexofenadine-pseudoephedrine (ALLEGRA-D) 60-120 MG per tablet Take 1 tablet by mouth every 12 (twelve) hours. 11/16/13   Burgess AmorJulie Jeevan Kalla, PA-C  HYDROcodone-acetaminophen (NORCO/VICODIN) 5-325 MG per tablet Take 1 tablet by mouth every 4 (four) hours as needed for moderate pain. 05/28/14   Burgess AmorJulie Larnie Heart, PA-C  ibuprofen (ADVIL,MOTRIN) 200 MG tablet Take 800 mg by mouth every 6 (six) hours as needed.    Historical Provider, MD   BP 122/71  Pulse 75  Temp(Src) 97.6 F (36.4 C) (Oral)  Resp 20  Ht 6' (1.829 m)  Wt 294 lb 3.2 oz (133.448 kg)  BMI 39.89 kg/m2  SpO2 97% Physical Exam  Constitutional: He is oriented to person, place, and time. He appears well-developed and well-nourished. No distress.  HENT:  Head: Normocephalic and atraumatic.  Right Ear: Tympanic membrane and external ear normal.  Left Ear: Tympanic membrane and external ear normal.  Mouth/Throat: Oropharynx is clear and moist and mucous membranes are normal. No oral lesions. No trismus in the jaw. Dental abscesses present.  #23-27 with moderate gingival inflammation, dental decay.  Cavity noted along gingival line at #8, although this tooth is nontender.  No abscess.  Eyes: Conjunctivae are normal.  Neck: Normal range of motion. Neck supple.  Cardiovascular: Normal rate and normal heart sounds.   Pulmonary/Chest: Effort normal.  Abdominal: He exhibits no distension.  Musculoskeletal: Normal range of motion.  Lymphadenopathy:    He has no cervical adenopathy.  Neurological: He is alert and oriented to person, place, and time.  Skin: Skin is warm and dry. No erythema.  Psychiatric: He has a normal mood and  affect.    ED Course  Procedures (including critical care time) Labs Review Labs Reviewed - No data to display  Imaging Review No results found.   EKG Interpretation None      MDM   Final diagnoses:  Dental infection  Dental caries   Patient was prescribed amoxicillin and hydrocodone.  He was encouraged to followup with Dr. Lovell SheehanJenkins next week as already scheduled.  No abscess that would require I&D at this time.    Burgess AmorJulie Berry Godsey, PA-C 05/28/14 2122

## 2014-05-28 NOTE — ED Notes (Signed)
Patient c/o right sided upper and lower tooth pain.

## 2014-05-28 NOTE — ED Notes (Signed)
Pain upper rt ant tooth , caries present, Pain rt side.lower gums, with swelling.  Has taken ambesol and goody powders without relief

## 2014-05-28 NOTE — Discharge Instructions (Signed)
Dental Pain A tooth ache may be caused by cavities (tooth decay). Cavities expose the nerve of the tooth to air and hot or cold temperatures. It may come from an infection or abscess (also called a boil or furuncle) around your tooth. It is also often caused by dental caries (tooth decay). This causes the pain you are having. DIAGNOSIS  Your caregiver can diagnose this problem by exam. TREATMENT   If caused by an infection, it may be treated with medications which kill germs (antibiotics) and pain medications as prescribed by your caregiver. Take medications as directed.  Only take over-the-counter or prescription medicines for pain, discomfort, or fever as directed by your caregiver.  Whether the tooth ache today is caused by infection or dental disease, you should see your dentist as soon as possible for further care. SEEK MEDICAL CARE IF: The exam and treatment you received today has been provided on an emergency basis only. This is not a substitute for complete medical or dental care. If your problem worsens or new problems (symptoms) appear, and you are unable to meet with your dentist, call or return to this location. SEEK IMMEDIATE MEDICAL CARE IF:   You have a fever.  You develop redness and swelling of your face, jaw, or neck.  You are unable to open your mouth.  You have severe pain uncontrolled by pain medicine. MAKE SURE YOU:   Understand these instructions.  Will watch your condition.  Will get help right away if you are not doing well or get worse. Document Released: 11/30/2005 Document Revised: 02/22/2012 Document Reviewed: 07/18/2008 [facility] Patient Information 2014 [place], [place].   Complete your entire course of antibiotics as prescribed.  You  may use the hydrocodone for pain relief but do not drive within 4 hours of taking as this will make you drowsy.  Avoid applying heat or ice to this abscess area which can worsen your symptoms.  You may use warm salt water  swish and spit treatment or half peroxide and water swish and spit after meals to keep this area clean as discussed.  Keep your appointment with Dr Lovell SheehanJenkins for further management of your symptoms.

## 2014-05-29 NOTE — ED Provider Notes (Signed)
Medical screening examination/treatment/procedure(s) were performed by non-physician practitioner and as supervising physician I was immediately available for consultation/collaboration.   EKG Interpretation None        Wojciech Willetts M Dresean Beckel, DO 05/29/14 1611 

## 2014-09-11 ENCOUNTER — Emergency Department (HOSPITAL_COMMUNITY)
Admission: EM | Admit: 2014-09-11 | Discharge: 2014-09-11 | Disposition: A | Discharge status: Home / Self Care | Payer: Medicaid Other | Attending: Emergency Medicine | Admitting: Emergency Medicine

## 2014-09-11 ENCOUNTER — Encounter (HOSPITAL_COMMUNITY): Payer: Self-pay | Admitting: Emergency Medicine

## 2014-09-11 DIAGNOSIS — K0889 Other specified disorders of teeth and supporting structures: Secondary | ICD-10-CM

## 2014-09-11 DIAGNOSIS — Z792 Long term (current) use of antibiotics: Secondary | ICD-10-CM | POA: Diagnosis not present

## 2014-09-11 DIAGNOSIS — K089 Disorder of teeth and supporting structures, unspecified: Secondary | ICD-10-CM | POA: Insufficient documentation

## 2014-09-11 DIAGNOSIS — Z79899 Other long term (current) drug therapy: Secondary | ICD-10-CM | POA: Insufficient documentation

## 2014-09-11 DIAGNOSIS — K029 Dental caries, unspecified: Secondary | ICD-10-CM | POA: Diagnosis not present

## 2014-09-11 DIAGNOSIS — F17200 Nicotine dependence, unspecified, uncomplicated: Secondary | ICD-10-CM | POA: Diagnosis not present

## 2014-09-11 MED ORDER — HYDROCODONE-ACETAMINOPHEN 5-325 MG PO TABS: ORAL_TABLET | ORAL | Status: DC

## 2014-09-11 MED ORDER — AMOXICILLIN 500 MG PO CAPS: 500.0000 mg | ORAL_CAPSULE | Freq: Three times a day (TID) | ORAL | Status: DC

## 2014-09-11 NOTE — Discharge Instructions (Signed)

## 2014-09-11 NOTE — ED Notes (Signed)
Pt has had lower jaw dental pain x 1 week, taking goodie powder without relief.

## 2014-09-11 NOTE — ED Notes (Signed)
Patient given discharge instruction, verbalized understand. Patient ambulatory out of the department.  

## 2014-09-11 NOTE — ED Provider Notes (Signed)
CSN: [id number]     Arrival date & time 09/11/14  1649 History   First MD Initiated Contact with Patient 09/11/14 1701     Chief Complaint  Patient presents with  . Dental Pain     (Consider location/radiation/quality/duration/timing/severity/associated sxs/prior Treatment) HPI   Cory [name] is a 33 y.o. male who presents to the Emergency Department complaining of dental pain for one week.  He states this is a recurring problem.  He c/o pain to teh right upper lateral incisor and right lower molars.  Pain is worse with hot or cold foods.  He has tried OTC medications and topicals w/o relief.  He denies facial swelling, neck pain, difficulty swallowing or breathing or fever.     History reviewed. No pertinent past medical history. History reviewed. No pertinent past surgical history. History reviewed. No pertinent family history. History  Substance Use Topics  . Smoking status: Current Every Day Smoker -- 0.50 packs/day    Types: Cigarettes  . Smokeless tobacco: Not on file  . Alcohol Use: No    Review of Systems  Constitutional: Negative for fever and appetite change.  HENT: Positive for dental problem. Negative for congestion, facial swelling, sore throat and trouble swallowing.   Eyes: Negative for pain and visual disturbance.  Musculoskeletal: Negative for neck pain and neck stiffness.  Neurological: Negative for dizziness, facial asymmetry and headaches.  Hematological: Negative for adenopathy.  All other systems reviewed and are negative.     Allergies  Ultram  Home Medications   Prior to Admission medications   Medication Sig Start Date End Date Taking? Authorizing Provider  acetaminophen (TYLENOL) 500 MG tablet Take 1,000-2,000 mg by mouth every 6 (six) hours as needed.    Historical Provider, MD  amoxicillin (AMOXIL) 500 MG capsule Take 1 capsule (500 mg total) by mouth 3 (three) times daily. 11/16/13   Burgess AmorJulie Idol, PA-C  chlorpheniramine-HYDROcodone  (TUSSIONEX PENNKINETIC ER) 10-8 MG/5ML LQCR Take 5 mLs by mouth 2 (two) times daily. 11/16/13   Burgess AmorJulie Idol, PA-C  fexofenadine-pseudoephedrine (ALLEGRA-D) 60-120 MG per tablet Take 1 tablet by mouth every 12 (twelve) hours. 11/16/13   Burgess AmorJulie Idol, PA-C  HYDROcodone-acetaminophen (NORCO/VICODIN) 5-325 MG per tablet Take 1 tablet by mouth every 4 (four) hours as needed for moderate pain. 05/28/14   Burgess AmorJulie Idol, PA-C  ibuprofen (ADVIL,MOTRIN) 200 MG tablet Take 800 mg by mouth every 6 (six) hours as needed.    Historical Provider, MD   BP 123/75  Temp(Src) 98.4 F (36.9 C) (Oral)  Resp 18  Ht 6' (1.829 m)  Wt 285 lb (129.275 kg)  BMI 38.64 kg/m2  SpO2 100% Physical Exam  Nursing note and vitals reviewed. Constitutional: He is oriented to person, place, and time. He appears well-developed and well-nourished. No distress.  HENT:  Head: Normocephalic and atraumatic.  Right Ear: Tympanic membrane and ear canal normal.  Left Ear: Tympanic membrane and ear canal normal.  Mouth/Throat: Uvula is midline, oropharynx is clear and moist and mucous membranes are normal. No trismus in the jaw. Dental caries present. No dental abscesses or uvula swelling.    Widespread dental decay with ttp of the right upper lateral incisor and right lower molar. No facial swelling, obvious dental abscess, trismus, or sublingual abnml.    Neck: Normal range of motion. Neck supple.  Cardiovascular: Normal rate, regular rhythm and normal heart sounds.   No murmur heard. Pulmonary/Chest: Effort normal and breath sounds normal.  Musculoskeletal: Normal range of motion.  Lymphadenopathy:  He has no cervical adenopathy.  Neurological: He is alert and oriented to person, place, and time. He exhibits normal muscle tone. Coordination normal.  Skin: Skin is warm and dry.    ED Course  Procedures (including critical care time) Labs Review Labs Reviewed - No data to display  Imaging Review No results found.   EKG  Interpretation None      MDM   Final diagnoses:  Pain, dental    Patient is well appearing.  Has hx of same.  Has seen dentist in the past for same tooth.  No concerning sx's for infection to floor of the mouth or deep structures of the neck.  rx for amoxil and hydrocodone #15.      [name] L. Trisha Mangle, PA-C 09/12/14 2321

## 2014-09-11 NOTE — ED Notes (Signed)
Pt took antibiotic a month ago and got better, needs to have tooth pulled

## 2014-09-16 NOTE — ED Provider Notes (Signed)
Medical screening examination/treatment/procedure(s) were performed by non-physician practitioner and as supervising physician I was immediately available for consultation/collaboration.   EKG Interpretation None        Vanetta MuldersScott Cataldo Cosgriff, MD 09/16/14 1340

## 2015-03-18 ENCOUNTER — Emergency Department (HOSPITAL_COMMUNITY)
Admission: EM | Admit: 2015-03-18 | Discharge: 2015-03-18 | Disposition: A | Discharge status: Home / Self Care | Payer: Medicaid Other | Attending: Emergency Medicine | Admitting: Emergency Medicine

## 2015-03-18 ENCOUNTER — Encounter (HOSPITAL_COMMUNITY): Payer: Self-pay | Admitting: Emergency Medicine

## 2015-03-18 DIAGNOSIS — Z792 Long term (current) use of antibiotics: Secondary | ICD-10-CM | POA: Insufficient documentation

## 2015-03-18 DIAGNOSIS — Z720 Tobacco use: Secondary | ICD-10-CM | POA: Diagnosis not present

## 2015-03-18 DIAGNOSIS — R05 Cough: Secondary | ICD-10-CM | POA: Diagnosis present

## 2015-03-18 DIAGNOSIS — Z79899 Other long term (current) drug therapy: Secondary | ICD-10-CM | POA: Insufficient documentation

## 2015-03-18 DIAGNOSIS — J069 Acute upper respiratory infection, unspecified: Secondary | ICD-10-CM | POA: Insufficient documentation

## 2015-03-18 MED ORDER — HYDROCODONE-HOMATROPINE 5-1.5 MG/5ML PO SYRP: 5.0000 mL | ORAL_SOLUTION | Freq: Four times a day (QID) | ORAL | Status: DC | PRN

## 2015-03-18 MED ORDER — PREDNISONE 10 MG PO TABS: ORAL_TABLET | ORAL | Status: DC

## 2015-03-18 NOTE — ED Provider Notes (Signed)
CSN: [id number]     Arrival date & time 03/18/15  1757 History  This chart was scribed for Ivery QualeHobson [name], PA-C with Vanetta MuldersScott Zackowski, MD by Tonye RoyaltyJoshua Chen, ED Scribe. This patient was seen in room APFT21/APFT21 and the patient's care was [facility] PM.    Chief Complaint  Patient presents with  . Cough   Patient is a 34 y.o. male presenting with cough. The history is provided by the patient. No language interpreter was used.  Cough Cough characteristics:  Non-productive Severity:  Mild Onset quality:  Sudden Duration:  1 day Timing:  Constant Progression:  Unchanged Chronicity:  New Smoker: yes   Context: sick contacts   Relieved by:  None tried Worsened by:  Nothing tried Ineffective treatments:  None tried Associated symptoms: chest pain     HPI Comments: Cory [name] is a 34 y.o. male who presents to the Emergency Department complaining of cough and nasal congestion with onset last night. He reports associated hoarse voice and chest pain when coughing. His 2 children are sick with similar symptoms.   History reviewed. No pertinent past medical history. History reviewed. No pertinent past surgical history. No family history on file. History  Substance Use Topics  . Smoking status: Current Every Day Smoker -- 0.50 packs/day    Types: Cigarettes  . Smokeless tobacco: Not on file  . Alcohol Use: No    Review of Systems  HENT: Positive for congestion and voice change.   Respiratory: Positive for cough.   Cardiovascular: Positive for chest pain.  All other systems reviewed and are negative.     Allergies  Ultram  Home Medications   Prior to Admission medications   Medication Sig Start Date End Date Taking? Authorizing Provider  acetaminophen (TYLENOL) 500 MG tablet Take 1,000-2,000 mg by mouth every 6 (six) hours as needed.    Historical Provider, MD  amoxicillin (AMOXIL) 500 MG capsule Take 1 capsule (500 mg total) by mouth 3 (three) times daily. 11/16/13   Burgess AmorJulie  Idol, PA-C  amoxicillin (AMOXIL) 500 MG capsule Take 1 capsule (500 mg total) by mouth 3 (three) times daily. For 10 days 09/11/14   Severiano Gilbertammi Triplett, PA-C  chlorpheniramine-HYDROcodone (TUSSIONEX PENNKINETIC ER) 10-8 MG/5ML LQCR Take 5 mLs by mouth 2 (two) times daily. 11/16/13   Burgess AmorJulie Idol, PA-C  fexofenadine-pseudoephedrine (ALLEGRA-D) 60-120 MG per tablet Take 1 tablet by mouth every 12 (twelve) hours. 11/16/13   Burgess AmorJulie Idol, PA-C  HYDROcodone-acetaminophen (NORCO/VICODIN) 5-325 MG per tablet Take 1 tablet by mouth every 4 (four) hours as needed for moderate pain. 05/28/14   Burgess AmorJulie Idol, PA-C  HYDROcodone-acetaminophen (NORCO/VICODIN) 5-325 MG per tablet Take one-two tabs po q 4-6 hrs prn pain 09/11/14   Tammi Triplett, PA-C  ibuprofen (ADVIL,MOTRIN) 200 MG tablet Take 800 mg by mouth every 6 (six) hours as needed.    Historical Provider, MD   BP 113/69 mmHg  Pulse 92  Temp(Src) 98.7 F (37.1 C) (Oral)  Resp 20  Ht 6' (1.829 m)  Wt 280 lb (127.007 kg)  BMI 37.97 kg/m2  SpO2 99% Physical Exam  Constitutional: He is oriented to person, place, and time. He appears well-developed and well-nourished.  HENT:  Head: Normocephalic and atraumatic.  Voice is hoarse Tonsils enlarged with craters but no exudate  Eyes: Conjunctivae are normal.  Neck: Normal range of motion. Neck supple.  Cardiovascular: Normal rate, regular rhythm and normal heart sounds.   No murmur heard. Pulmonary/Chest: Effort normal. No respiratory distress. He has  wheezes (few scattered wheezes). He has no rales.  Symmetrical rise and fall of chest  Musculoskeletal: Normal range of motion.  Capillary refill <2 seconds  Lymphadenopathy:    He has no cervical adenopathy.  Neurological: He is alert and oriented to person, place, and time.  Skin: Skin is warm and dry. No rash noted.  Psychiatric: He has a normal mood and affect.  Nursing note and vitals reviewed.   ED Course  Procedures (including critical care  time)  DIAGNOSTIC STUDIES: Oxygen Saturation is 99% on room air, normal by my interpretation.    COORDINATION OF CARE: 6:46 PM Discussed treatment plan with patient at beside, including Hycodan cough syrup. The patient agrees with the plan and has no further questions at this time.   Labs Review Labs Reviewed - No data to display  Imaging Review No results found.   EKG Interpretation None      MDM  Pt's exam is consistent with uri. Vital signs are stable. Pt speaks in complete sentences. Plan - Rx for hycodan and prednisone. Pt to use voice rest. Tylenol and ibuprofen for  Fever and soreness.   Final diagnoses:  None    *I have reviewed nursing notes, vital signs, and all appropriate lab and imaging results for this patient.  **I personally performed the services described in this documentation, which was scribed in my presence. The recorded information has been reviewed and is accurate.[name], PA-C 03/18/15 1930  Vanetta Mulders, MD 03/18/15 [phone]

## 2015-03-18 NOTE — Discharge Instructions (Signed)
Voice rest will be helpful. Use tylenol or ibuprofen for fever or aching. Please use prednisone taper as prescribed. Use Hycodan for cough. This medication may cause drowsiness, use with caution. Upper Respiratory Infection, Adult An upper respiratory infection (URI) is also sometimes known as the common cold. The upper respiratory tract includes the nose, sinuses, throat, trachea, and bronchi. Bronchi are the airways leading to the lungs. Most people improve within 1 week, but symptoms can last up to 2 weeks. A residual cough may last even longer.  CAUSES Many different viruses can infect the tissues lining the upper respiratory tract. The tissues become irritated and inflamed and often become very moist. Mucus production is also common. A cold is contagious. You can easily spread the virus to others by oral contact. This includes kissing, sharing a glass, coughing, or sneezing. Touching your mouth or nose and then touching a surface, which is then touched by another person, can also spread the virus. SYMPTOMS  Symptoms typically develop 1 to 3 days after you come in contact with a cold virus. Symptoms vary from person to person. They may include:  Runny nose.  Sneezing.  Nasal congestion.  Sinus irritation.  Sore throat.  Loss of voice (laryngitis).  Cough.  Fatigue.  Muscle aches.  Loss of appetite.  Headache.  Low-grade fever. DIAGNOSIS  You might diagnose your own cold based on familiar symptoms, since most people get a cold 2 to 3 times a year. Your caregiver can confirm this based on your exam. Most importantly, your caregiver can check that your symptoms are not due to another disease such as strep throat, sinusitis, pneumonia, asthma, or epiglottitis. Blood tests, throat tests, and X-rays are not necessary to diagnose a common cold, but they may sometimes be helpful in excluding other more serious diseases. Your caregiver will decide if any further tests are required. RISKS  AND COMPLICATIONS  You may be at risk for a more severe case of the common cold if you smoke cigarettes, have chronic heart disease (such as heart failure) or lung disease (such as asthma), or if you have a weakened immune system. The very young and very old are also at risk for more serious infections. Bacterial sinusitis, middle ear infections, and bacterial pneumonia can complicate the common cold. The common cold can worsen asthma and chronic obstructive pulmonary disease (COPD). Sometimes, these complications can require emergency medical care and may be life-threatening. PREVENTION  The best way to protect against getting a cold is to practice good hygiene. Avoid oral or hand contact with people with cold symptoms. Wash your hands often if contact occurs. There is no clear evidence that vitamin C, vitamin E, echinacea, or exercise reduces the chance of developing a cold. However, it is always recommended to get plenty of rest and practice good nutrition. TREATMENT  Treatment is directed at relieving symptoms. There is no cure. Antibiotics are not effective, because the infection is caused by a virus, not by bacteria. Treatment may include:  Increased fluid intake. Sports drinks offer valuable electrolytes, sugars, and fluids.  Breathing heated mist or steam (vaporizer or shower).  Eating chicken soup or other clear broths, and maintaining good nutrition.  Getting plenty of rest.  Using gargles or lozenges for comfort.  Controlling fevers with ibuprofen or acetaminophen as directed by your caregiver.  Increasing usage of your inhaler if you have asthma. Zinc gel and zinc lozenges, taken in the first 24 hours of the common cold, can shorten the duration and  lessen the severity of symptoms. Pain medicines may help with fever, muscle aches, and throat pain. A variety of non-prescription medicines are available to treat congestion and runny nose. Your caregiver can make recommendations and may  suggest nasal or lung inhalers for other symptoms.  HOME CARE INSTRUCTIONS   Only take over-the-counter or prescription medicines for pain, discomfort, or fever as directed by your caregiver.  Use a warm mist humidifier or inhale steam from a shower to increase air moisture. This may keep secretions moist and make it easier to breathe.  Drink enough water and fluids to keep your urine clear or pale yellow.  Rest as needed.  Return to work when your temperature has returned to normal or as your caregiver advises. You may need to stay home longer to avoid infecting others. You can also use a face mask and careful hand washing to prevent spread of the virus. SEEK MEDICAL CARE IF:   After the first few days, you feel you are getting worse rather than better.  You need your caregiver's advice about medicines to control symptoms.  You develop chills, worsening shortness of breath, or brown or red sputum. These may be signs of pneumonia.  You develop yellow or brown nasal discharge or pain in the face, especially when you bend forward. These may be signs of sinusitis.  You develop a fever, swollen neck glands, pain with swallowing, or white areas in the back of your throat. These may be signs of strep throat. SEEK IMMEDIATE MEDICAL CARE IF:   You have a fever.  You develop severe or persistent headache, ear pain, sinus pain, or chest pain.  You develop wheezing, a prolonged cough, cough up blood, or have a change in your usual mucus (if you have chronic lung disease).  You develop sore muscles or a stiff neck. Document Released: 05/26/2001 Document Revised: 02/22/2012 Document Reviewed: 03/07/2014 [facility] Patient Information 2015 [place], [place]. This information is not intended to replace advice given to you by your health care provider. Make sure you discuss any questions you have with your health care provider.

## 2015-03-18 NOTE — ED Notes (Signed)
PT c/o cough, sinus congestion and loss of voice since last night.

## 2015-03-18 NOTE — ED Notes (Signed)
Hoarse since last night, cough, chest hurts to cough, NP cough,  NO NVD. Alert,

## 2015-03-31 ENCOUNTER — Emergency Department (HOSPITAL_COMMUNITY): Payer: Medicaid Other

## 2015-03-31 ENCOUNTER — Encounter (HOSPITAL_COMMUNITY): Payer: Self-pay | Admitting: Emergency Medicine

## 2015-03-31 ENCOUNTER — Emergency Department (HOSPITAL_COMMUNITY)
Admission: EM | Admit: 2015-03-31 | Discharge: 2015-03-31 | Disposition: A | Discharge status: Home / Self Care | Payer: Medicaid Other | Attending: Emergency Medicine | Admitting: Emergency Medicine

## 2015-03-31 DIAGNOSIS — R51 Headache: Secondary | ICD-10-CM | POA: Insufficient documentation

## 2015-03-31 DIAGNOSIS — Z720 Tobacco use: Secondary | ICD-10-CM | POA: Diagnosis not present

## 2015-03-31 DIAGNOSIS — Z792 Long term (current) use of antibiotics: Secondary | ICD-10-CM | POA: Diagnosis not present

## 2015-03-31 DIAGNOSIS — R55 Syncope and collapse: Secondary | ICD-10-CM | POA: Diagnosis not present

## 2015-03-31 DIAGNOSIS — R519 Headache, unspecified: Secondary | ICD-10-CM

## 2015-03-31 DIAGNOSIS — Z79899 Other long term (current) drug therapy: Secondary | ICD-10-CM | POA: Diagnosis not present

## 2015-03-31 LAB — COMPREHENSIVE METABOLIC PANEL
ALK PHOS: 75 U/L (ref 39–117)
ALT: 28 U/L (ref 0–53)
AST: 27 U/L (ref 0–37)
Albumin: 3.6 g/dL (ref 3.5–5.2)
Anion gap: 8 (ref 5–15)
BUN: 8 mg/dL (ref 6–23)
CO2: 23 mmol/L (ref 19–32)
CREATININE: 0.72 mg/dL (ref 0.50–1.35)
Calcium: 8.2 mg/dL — ABNORMAL LOW (ref 8.4–10.5)
Chloride: 103 mmol/L (ref 96–112)
GFR calc Af Amer: >90 mL/min (ref >90)
GFR calc non Af Amer: >90 mL/min (ref >90)
Glucose, Bld: 122 mg/dL — ABNORMAL HIGH (ref 70–99)
POTASSIUM: 3.4 mmol/L — AB (ref 3.5–5.1)
Sodium: 134 mmol/L — ABNORMAL LOW (ref 135–145)
TOTAL PROTEIN: 6.7 g/dL (ref 6.0–8.3)
Total Bilirubin: 0.4 mg/dL (ref 0.3–1.2)

## 2015-03-31 LAB — CBC WITH DIFFERENTIAL/PLATELET
Basophils Absolute: 0 10*3/uL (ref 0.0–0.1)
Basophils Relative: 0 % (ref 0–1)
EOS ABS: 0.1 10*3/uL (ref 0.0–0.7)
Eosinophils Relative: 1 % (ref 0–5)
HCT: 37.9 % — ABNORMAL LOW (ref 39.0–52.0)
Hemoglobin: 12.9 g/dL — ABNORMAL LOW (ref 13.0–17.0)
Lymphocytes Relative: 28 % (ref 12–46)
Lymphs Abs: 2.3 10*3/uL (ref 0.7–4.0)
MCH: 29.7 pg (ref 26.0–34.0)
MCHC: 34 g/dL (ref 30.0–36.0)
MCV: 87.1 fL (ref 78.0–100.0)
MONOS PCT: 10 % (ref 3–12)
Monocytes Absolute: 0.8 10*3/uL (ref 0.1–1.0)
Neutro Abs: 5 10*3/uL (ref 1.7–7.7)
Neutrophils Relative %: 61 % (ref 43–77)
Platelets: 240 10*3/uL (ref 150–400)
RBC: 4.35 MIL/uL (ref 4.22–5.81)
RDW: 13.1 % (ref 11.5–15.5)
WBC: 8.3 10*3/uL (ref 4.0–10.5)

## 2015-03-31 MED ORDER — PROMETHAZINE HCL 25 MG/ML IJ SOLN
12.5000 mg | Freq: Once | INTRAMUSCULAR | Status: AC
  Administered 2015-03-31: 12.5 mg via INTRAVENOUS
  Filled 2015-03-31: qty 1

## 2015-03-31 MED ORDER — SODIUM CHLORIDE 0.9 % IV SOLN
INTRAVENOUS | Status: DC
  Administered 2015-03-31: 16:00:00 via INTRAVENOUS

## 2015-03-31 MED ORDER — HYDROMORPHONE HCL 1 MG/ML IJ SOLN
1.0000 mg | Freq: Once | INTRAMUSCULAR | Status: AC
Start: 2015-03-31 — End: 2015-03-31
  Administered 2015-03-31: 1 mg via INTRAVENOUS
  Filled 2015-03-31: qty 1

## 2015-03-31 MED ORDER — SODIUM CHLORIDE 0.9 % IV SOLN
INTRAVENOUS | Status: DC
Start: 2015-04-01 — End: 2015-03-31

## 2015-03-31 MED ORDER — DEXAMETHASONE SODIUM PHOSPHATE 4 MG/ML IJ SOLN
10.0000 mg | Freq: Once | INTRAMUSCULAR | Status: AC
  Administered 2015-03-31: 10 mg via INTRAVENOUS
  Filled 2015-03-31: qty 3

## 2015-03-31 MED ORDER — DIPHENHYDRAMINE HCL 50 MG/ML IJ SOLN
25.0000 mg | Freq: Once | INTRAMUSCULAR | Status: AC
  Administered 2015-03-31: 25 mg via INTRAVENOUS
  Filled 2015-03-31: qty 1

## 2015-03-31 MED ORDER — SODIUM CHLORIDE 0.9 % IV BOLUS (SEPSIS): 250.0000 mL | Freq: Once | INTRAVENOUS | Status: DC

## 2015-03-31 MED ORDER — SODIUM CHLORIDE 0.9 % IV BOLUS (SEPSIS)
1000.0000 mL | Freq: Once | INTRAVENOUS | Status: AC
  Administered 2015-03-31: 1000 mL via INTRAVENOUS

## 2015-03-31 NOTE — Discharge Instructions (Signed)
Rest work note provided tomorrow to be off work. If not improved after tomorrow in follow-up. Return for any new or worse symptoms. Workup for the headache and for the feeling of a most passing out all negative.

## 2015-03-31 NOTE — ED Notes (Signed)
MD at bedside. 

## 2015-03-31 NOTE — ED Notes (Signed)
Pt alert & oriented x4, stable gait. Patient given discharge instructions, paperwork & prescription(s). Patient  instructed to stop at the registration desk to finish any additional paperwork. Patient verbalized understanding. Pt left department w/ no further questions. 

## 2015-03-31 NOTE — ED Notes (Signed)
Pt reports headache x 1 week, pt states today he felt light headed and felt like he was going to pass out.

## 2015-03-31 NOTE — ED Provider Notes (Signed)
CSN: [id number]     Arrival date & time 03/31/15  1559 History   First MD Initiated Contact with Patient 03/31/15 1607     Chief Complaint  Patient presents with  . Headache     (Consider location/radiation/quality/duration/timing/severity/associated sxs/prior Treatment) Patient is a 34 y.o. male presenting with headaches. The history is provided by the patient.  Headache Associated symptoms: photophobia   Associated symptoms: no back pain, no congestion, no fever, no neck pain and no sinus pressure    patient would complain of a headache 1 week gradual onset predominantly behind the eyes. Then spread throughout the whole head area but still mostly behind the eyes. Headache is 10 out of 10. Patient's had some light sensitive to the. Today developed some dizzy feeling not vertigo felt lightheaded and felt like maybe he was given a pass out. No nausea no vomiting no fevers no upper respiratory infection symptoms no abdominal pain. No rash. No history of migraines.  History reviewed. No pertinent past medical history. History reviewed. No pertinent past surgical history. No family history on file. History  Substance Use Topics  . Smoking status: Current Every Day Smoker -- 0.50 packs/day    Types: Cigarettes  . Smokeless tobacco: Not on file  . Alcohol Use: No    Review of Systems  Constitutional: Negative for fever.  HENT: Negative for congestion and sinus pressure.   Eyes: Positive for photophobia. Negative for visual disturbance.  Respiratory: Negative for shortness of breath.   Cardiovascular: Negative for chest pain.  Genitourinary: Negative for dysuria.  Musculoskeletal: Negative for back pain and neck pain.  Neurological: Negative for headaches.  Hematological: Does not bruise/bleed easily.  Psychiatric/Behavioral: Negative for confusion.      Allergies  Ultram  Home Medications   Prior to Admission medications   Medication Sig Start Date End Date Taking?  Authorizing Provider  acetaminophen (TYLENOL) 500 MG tablet Take 500-1,000 mg by mouth every 6 (six) hours as needed for mild pain or moderate pain.   Yes Historical Provider, MD  aspirin 325 MG tablet Take 325 mg by mouth daily as needed for mild pain, moderate pain or headache.   Yes Historical Provider, MD  Aspirin-Acetaminophen-Caffeine (GOODY HEADACHE PO) Take 1 packet by mouth daily as needed (for headache).   Yes Historical Provider, MD  HYDROcodone-acetaminophen (NORCO/VICODIN) 5-325 MG per tablet Take 1 tablet by mouth every 4 (four) hours as needed for moderate pain. 05/28/14  Yes Raynelle FanningJulie Idol, PA-C  ibuprofen (ADVIL,MOTRIN) 200 MG tablet Take 600-800 mg by mouth every 6 (six) hours as needed.    Yes Historical Provider, MD  amoxicillin (AMOXIL) 500 MG capsule Take 1 capsule (500 mg total) by mouth 3 (three) times daily. Patient not taking: Reported on 03/18/2015 11/16/13   Burgess AmorJulie Idol, PA-C  amoxicillin (AMOXIL) 500 MG capsule Take 1 capsule (500 mg total) by mouth 3 (three) times daily. For 10 days Patient not taking: Reported on 03/18/2015 09/11/14   Tammy Triplett, PA-C  chlorpheniramine-HYDROcodone (TUSSIONEX PENNKINETIC ER) 10-8 MG/5ML LQCR Take 5 mLs by mouth 2 (two) times daily. Patient not taking: Reported on 03/18/2015 11/16/13   Burgess AmorJulie Idol, PA-C  fexofenadine-pseudoephedrine (ALLEGRA-D) 60-120 MG per tablet Take 1 tablet by mouth every 12 (twelve) hours. Patient not taking: Reported on 03/18/2015 11/16/13   Burgess AmorJulie Idol, PA-C  HYDROcodone-acetaminophen (NORCO/VICODIN) 5-325 MG per tablet Take one-two tabs po q 4-6 hrs prn pain Patient not taking: Reported on 03/18/2015 09/11/14   Tammy Triplett, PA-C  HYDROcodone-homatropine (HYCODAN) 5-1.5  MG/5ML syrup Take 5 mLs by mouth every 6 (six) hours as needed. Patient not taking: Reported on 03/31/2015 03/18/15   [name], PA-C  predniSONE (DELTASONE) 10 MG tablet 5,4,3,2,1 - take with food Patient not taking: Reported on 03/31/2015 03/18/15   [name], PA-C   BP 108/60 mmHg  Pulse 59  Temp(Src) 98.1 F (36.7 C) (Oral)  Resp 18  Ht 6' (1.829 m)  Wt 280 lb (127.007 kg)  BMI 37.97 kg/m2  SpO2 99% Physical Exam  Constitutional: He is oriented to person, place, and time. He appears well-developed and well-nourished. No distress.  HENT:  Head: Normocephalic and atraumatic.  Mouth/Throat: Oropharynx is clear and moist.  Eyes: Conjunctivae and EOM are normal. Pupils are equal, round, and reactive to light.  Neck: Normal range of motion. Neck supple.  Cardiovascular: Normal rate, regular rhythm and normal heart sounds.   Pulmonary/Chest: Effort normal and breath sounds normal. No respiratory distress.  Abdominal: Soft. Bowel sounds are normal. There is no tenderness.  Musculoskeletal: Normal range of motion.  Neurological: He is alert and oriented to person, place, and time. No cranial nerve deficit. He exhibits normal muscle tone. Coordination normal.  Skin: Skin is warm. No rash noted.  Nursing note and vitals reviewed.   ED Course  Procedures (including critical care time) Labs Review Labs Reviewed  CBC WITH DIFFERENTIAL/PLATELET - Abnormal; Notable for the following:    Hemoglobin 12.9 (*)    HCT 37.9 (*)    All other components within normal limits  COMPREHENSIVE METABOLIC PANEL - Abnormal; Notable for the following:    Sodium 134 (*)    Potassium 3.4 (*)    Glucose, Bld 122 (*)    Calcium 8.2 (*)    All other components within normal limits   Results for orders placed or performed during the hospital encounter of 03/31/15  CBC with Differential/Platelet  Result Value Ref Range   WBC 8.3 4.0 - 10.5 K/uL   RBC 4.35 4.22 - 5.81 MIL/uL   Hemoglobin 12.9 (L) 13.0 - 17.0 g/dL   HCT 47.8 (L) 29.5 - 62.1 %   MCV 87.1 78.0 - 100.0 fL   MCH 29.7 26.0 - 34.0 pg   MCHC 34.0 30.0 - 36.0 g/dL   RDW 30.8 65.7 - 84.6 %   Platelets 240 150 - 400 K/uL   Neutrophils Relative % 61 43 - 77 %   Neutro Abs 5.0 1.7 - 7.7 K/uL    Lymphocytes Relative 28 12 - 46 %   Lymphs Abs 2.3 0.7 - 4.0 K/uL   Monocytes Relative 10 3 - 12 %   Monocytes Absolute 0.8 0.1 - 1.0 K/uL   Eosinophils Relative 1 0 - 5 %   Eosinophils Absolute 0.1 0.0 - 0.7 K/uL   Basophils Relative 0 0 - 1 %   Basophils Absolute 0.0 0.0 - 0.1 K/uL  Comprehensive metabolic panel  Result Value Ref Range   Sodium 134 (L) 135 - 145 mmol/L   Potassium 3.4 (L) 3.5 - 5.1 mmol/L   Chloride 103 96 - 112 mmol/L   CO2 23 19 - 32 mmol/L   Glucose, Bld 122 (H) 70 - 99 mg/dL   BUN 8 6 - 23 mg/dL   Creatinine, Ser 9.62 0.50 - 1.35 mg/dL   Calcium 8.2 (L) 8.4 - 10.5 mg/dL   Total Protein 6.7 6.0 - 8.3 g/dL   Albumin 3.6 3.5 - 5.2 g/dL   AST 27 0 - 37 U/L  ALT 28 0 - 53 U/L   Alkaline Phosphatase 75 39 - 117 U/L   Total Bilirubin 0.4 0.3 - 1.2 mg/dL   GFR calc non Af Amer >90 >90 mL/min   GFR calc Af Amer >90 >90 mL/min   Anion gap 8 5 - 15    Imaging Review Dg Chest 2 View  03/31/2015   CLINICAL DATA:  Headache for 1 week, history of tobacco use  EXAM: CHEST  2 VIEW  COMPARISON:  11/16/2013  FINDINGS: The heart size and mediastinal contours are within normal limits. Both lungs are clear. The visualized skeletal structures are unremarkable.  IMPRESSION: No active cardiopulmonary disease.   Electronically Signed   By: Alcide Clever M.D.   On: 03/31/2015 17:14   Ct Head Wo Contrast  03/31/2015   CLINICAL DATA:  Headache for 1 week.  Near syncopal episode today.  EXAM: CT HEAD WITHOUT CONTRAST  TECHNIQUE: Contiguous axial images were obtained from the base of the skull through the vertex without intravenous contrast.  COMPARISON:  08/09/2002  FINDINGS: The ventricles and sulci are within normal limits for age. There is no evidence of acute infarct, intracranial hemorrhage, mass, midline shift, or extra-axial collection.  The orbits are unremarkable. Mastoid air cells are underpneumatized with some chronic fluid or soft tissue on the left and possibly right. There is  near complete opacification of the left sphenoid sinus and visualized portion of the left maxillary sinus with partial bilateral ethmoid air cell opacification also noted. There is no evidence of acute fracture.  IMPRESSION: 1. Unremarkable CT appearance of the brain. 2. Predominantly left-sided paranasal sinus mucosal disease.   Electronically Signed   By: Sebastian Ache   On: 03/31/2015 17:19     EKG Interpretation   Date/Time:  Sunday March 31 2015 16:20:34 EDT Ventricular Rate:  69 PR Interval:  172 QRS Duration: 89 QT Interval:  365 QTC Calculation: 391 R Axis:   55 Text Interpretation:  Sinus rhythm Confirmed by Afomia Blackley  MD, Kharon Hixon  ([id number]) on 03/31/2015 4:35:15 PM      MDM   Final diagnoses:  Near syncope  Headache   Patient with history of headache for one week. No history of migraines. But the headache had some migraine-like features. Has some photophobia headache was predominantly behind the eyes. Today patient on the way here almost passed out. That was a new complaint. Patient denies any fevers. Denies any nausea or vomiting. No history of injury. Workup negative head CT labs without any significant abnormalities. Chest x-ray also negative. EKG without acute changes. No leukocytosis no significant electrolyte abnormalities.  Patient treated with migraine cocktail Decadron Benadryl and Phenergan and a liter of fluid. Patient feeling much better. Do not feel clinically this is consistent with subarachnoid hemorrhage. Headache was not of sudden onset. It was a gradual onset.    Vanetta Mulders, MD 03/31/15 Windell Moment

## 2015-04-03 ENCOUNTER — Emergency Department (HOSPITAL_COMMUNITY)
Admission: EM | Admit: 2015-04-03 | Discharge: 2015-04-03 | Disposition: A | Discharge status: Home / Self Care | Payer: Medicaid Other | Attending: Emergency Medicine | Admitting: Emergency Medicine

## 2015-04-03 ENCOUNTER — Encounter (HOSPITAL_COMMUNITY): Payer: Self-pay | Admitting: Emergency Medicine

## 2015-04-03 DIAGNOSIS — Z720 Tobacco use: Secondary | ICD-10-CM | POA: Diagnosis not present

## 2015-04-03 DIAGNOSIS — Z8739 Personal history of other diseases of the musculoskeletal system and connective tissue: Secondary | ICD-10-CM | POA: Insufficient documentation

## 2015-04-03 DIAGNOSIS — Z7982 Long term (current) use of aspirin: Secondary | ICD-10-CM | POA: Insufficient documentation

## 2015-04-03 DIAGNOSIS — R51 Headache: Secondary | ICD-10-CM | POA: Insufficient documentation

## 2015-04-03 DIAGNOSIS — G8929 Other chronic pain: Secondary | ICD-10-CM

## 2015-04-03 HISTORY — DX: Low back pain: M54.5

## 2015-04-03 HISTORY — DX: Headache: R51

## 2015-04-03 HISTORY — DX: Low back pain, unspecified: M54.50

## 2015-04-03 HISTORY — DX: Headache, unspecified: R51.9

## 2015-04-03 MED ORDER — PROMETHAZINE HCL 25 MG PO TABS
25.0000 mg | ORAL_TABLET | Freq: Four times a day (QID) | ORAL | Status: DC | PRN
Start: 2015-04-03 — End: 2015-06-05

## 2015-04-03 MED ORDER — DIPHENHYDRAMINE HCL 50 MG/ML IJ SOLN
50.0000 mg | Freq: Once | INTRAMUSCULAR | Status: AC
  Administered 2015-04-03: 50 mg via INTRAMUSCULAR
  Filled 2015-04-03: qty 1

## 2015-04-03 MED ORDER — KETOROLAC TROMETHAMINE 60 MG/2ML IM SOLN
60.0000 mg | Freq: Once | INTRAMUSCULAR | Status: AC
  Administered 2015-04-03: 60 mg via INTRAMUSCULAR
  Filled 2015-04-03: qty 2

## 2015-04-03 MED ORDER — PROMETHAZINE HCL 25 MG/ML IJ SOLN
25.0000 mg | Freq: Once | INTRAMUSCULAR | Status: AC
  Administered 2015-04-03: 25 mg via INTRAMUSCULAR
  Filled 2015-04-03: qty 1

## 2015-04-03 NOTE — ED Provider Notes (Signed)
CSN: [id number]     Arrival date & time 04/03/15  1517 History   First MD Initiated Contact with Patient 04/03/15 1633     Chief Complaint  Patient presents with  . Headache      HPI Pt was seen at 1635. Per pt, c/o gradual onset and persistence of constant acute flair of his chronic headache since yesterday afternoon. Describes the headache as "throbbing" in his frontal and bitemporal areas, and per his usual chronic headache pain pattern for several years. Denies headache was sudden or maximal in onset or at any time.  Denies visual changes, no focal motor weakness, no tingling/numbness in extremities, no fevers, no neck pain, no rash.     Past Medical History  Diagnosis Date  . Low back pain   . Chronic headache    History reviewed. No pertinent past surgical history.  History  Substance Use Topics  . Smoking status: Current Every Day Smoker -- 0.50 packs/day    Types: Cigarettes  . Smokeless tobacco: Not on file  . Alcohol Use: No    Review of Systems ROS: Statement: All systems negative except as marked or noted in the HPI; Constitutional: Negative for fever and chills. ; ; Eyes: Negative for eye pain, redness and discharge. ; ; ENMT: Negative for ear pain, hoarseness, nasal congestion, sinus pressure and sore throat. ; ; Cardiovascular: Negative for chest pain, palpitations, diaphoresis, dyspnea and peripheral edema. ; ; Respiratory: Negative for cough, wheezing and stridor. ; ; Gastrointestinal: Negative for nausea, vomiting, diarrhea, abdominal pain, blood in stool, hematemesis, jaundice and rectal bleeding. . ; ; Genitourinary: Negative for dysuria, flank pain and hematuria. ; ; Musculoskeletal: Negative for back pain and neck pain. Negative for swelling and trauma.; ; Skin: Negative for pruritus, rash, abrasions, blisters, bruising and skin lesion.; ; Neuro: +headache. Negative for lightheadedness and neck stiffness. Negative for weakness, altered level of consciousness ,  altered mental status, extremity weakness, paresthesias, involuntary movement, seizure and syncope.      Allergies  Ultram  Home Medications   Prior to Admission medications   Medication Sig Start Date End Date Taking? Authorizing Provider  acetaminophen (TYLENOL) 500 MG tablet Take 500-1,000 mg by mouth every 6 (six) hours as needed for mild pain or moderate pain.    Historical Provider, MD  amoxicillin (AMOXIL) 500 MG capsule Take 1 capsule (500 mg total) by mouth 3 (three) times daily. Patient not taking: Reported on 03/18/2015 11/16/13   Burgess AmorJulie Idol, PA-C  amoxicillin (AMOXIL) 500 MG capsule Take 1 capsule (500 mg total) by mouth 3 (three) times daily. For 10 days Patient not taking: Reported on 03/18/2015 09/11/14   Tammy Triplett, PA-C  aspirin 325 MG tablet Take 325 mg by mouth daily as needed for mild pain, moderate pain or headache.    Historical Provider, MD  Aspirin-Acetaminophen-Caffeine (GOODY HEADACHE PO) Take 1 packet by mouth daily as needed (for headache).    Historical Provider, MD  chlorpheniramine-HYDROcodone (TUSSIONEX PENNKINETIC ER) 10-8 MG/5ML LQCR Take 5 mLs by mouth 2 (two) times daily. Patient not taking: Reported on 03/18/2015 11/16/13   Burgess AmorJulie Idol, PA-C  fexofenadine-pseudoephedrine (ALLEGRA-D) 60-120 MG per tablet Take 1 tablet by mouth every 12 (twelve) hours. Patient not taking: Reported on 03/18/2015 11/16/13   Burgess AmorJulie Idol, PA-C  HYDROcodone-acetaminophen (NORCO/VICODIN) 5-325 MG per tablet Take 1 tablet by mouth every 4 (four) hours as needed for moderate pain. 05/28/14   Burgess AmorJulie Idol, PA-C  HYDROcodone-acetaminophen (NORCO/VICODIN) 5-325 MG per tablet Take one-two  tabs po q 4-6 hrs prn pain Patient not taking: Reported on 03/18/2015 09/11/14   Tammy Triplett, PA-C  HYDROcodone-homatropine (HYCODAN) 5-1.5 MG/5ML syrup Take 5 mLs by mouth every 6 (six) hours as needed. Patient not taking: Reported on 03/31/2015 03/18/15   [name], PA-C  ibuprofen (ADVIL,MOTRIN) 200 MG  tablet Take 600-800 mg by mouth every 6 (six) hours as needed.     Historical Provider, MD  predniSONE (DELTASONE) 10 MG tablet 5,4,3,2,1 - take with food Patient not taking: Reported on 03/31/2015 03/18/15   [name], PA-C   BP 107/59 mmHg  Pulse 70  Temp(Src) 98.6 F (37 C) (Oral)  Resp 18  Ht 6' (1.829 m)  Wt 280 lb (127.007 kg)  BMI 37.97 kg/m2  SpO2 98% Physical Exam  1640; Physical examination:  Nursing notes reviewed; Vital signs and O2 SAT reviewed;  Constitutional: Well developed, Well nourished, Well hydrated, In no acute distress. Talking on phone and watching TV on my arrival to exam room.; Head:  Normocephalic, atraumatic; Eyes: EOMI, PERRL, No scleral icterus; ENMT: TM's clear bilat. +edemetous nasal turbinates bilat with clear rhinorrhea. Mouth and pharynx normal, Mucous membranes moist; Neck: Supple, Full range of motion, No lymphadenopathy; Cardiovascular: Regular rate and rhythm, No murmur, rub, or gallop; Respiratory: Breath sounds clear & equal bilaterally, No rales, rhonchi, wheezes.  Speaking full sentences with ease, Normal respiratory effort/excursion; Chest: Nontender, Movement normal; Abdomen: Soft, Nontender, Nondistended, Normal bowel sounds; Genitourinary: No CVA tenderness; Spine:  No midline CS, TS, LS tenderness.;; Extremities: Pulses normal, No tenderness, No edema, No calf edema or asymmetry.; Neuro: AA&Ox3, Major CN grossly intact. No facial droop. Speech clear. No gross focal motor or sensory deficits in extremities. Climbs on and off stretcher easily by himself. Gait steady.; Skin: Color normal, Warm, Dry.   ED Course  Procedures     EKG Interpretation None      MDM  MDM Reviewed: previous chart, nursing note and vitals Reviewed previous: labs and CT scan      1650:  Pt reports headaches since 2003 (last CT-H in [facility] 2003 was for headache); has not f/u with PMD or specialist regarding same. Reports acute flair of his chronic headache today, no  change from his usual chronic pain pattern. No red flags. Tx symptomatically, f/u PMD. Pt verb understanding.   Samuel Jester, DO 04/06/15 1224

## 2015-04-03 NOTE — Discharge Instructions (Signed)
°Emergency Department Resource Guide °1) Find a Doctor and Pay Out of Pocket °Although you won't have to find out who is covered by your insurance plan, it is a good idea to ask around and get recommendations. You will then need to call the office and see if the doctor you have chosen will accept you as a new patient and what types of options they offer for patients who are self-pay. Some doctors offer discounts or will set up payment plans for their patients who do not have insurance, but you will need to ask so you aren't surprised when you get to your appointment. ° °2) Contact Your Local Health Department °Not all health departments have doctors that can see patients for sick visits, but many do, so it is worth a call to see if yours does. If you don't know where your local health department is, you can check in your phone book. The CDC also has a tool to help you locate your state's health department, and many state websites also have listings of all of their local health departments. ° °3) Find a Walk-in Clinic °If your illness is not likely to be very severe or complicated, you may want to try a walk in clinic. These are popping up all over the country in pharmacies, drugstores, and shopping centers. They're usually staffed by nurse practitioners or physician assistants that have been trained to treat common illnesses and complaints. They're usually fairly quick and inexpensive. However, if you have serious medical issues or chronic medical problems, these are probably not your best option. ° °No Primary Care Doctor: °- Call [facility] at  832-8000 - they can help you locate a primary care doctor that  accepts your insurance, provides certain services, etc. °- Physician Referral Service- 1-800-533-3463 ° °Chronic Pain Problems: °Organization         Address  Phone   Notes  °[facility]  (336) 297-2271 Patients need to be referred by their primary care doctor.  ° °Medication  Assistance: °Organization         Address  Phone   Notes  °[place] [facility] [address] °[place], [place] [address] (336) 641-8030 --Must be a resident of [place] °-- Must have NO insurance coverage whatsoever (no Medicaid/ Medicare, etc.) °-- The pt. MUST have a primary care doctor that directs their care regularly and follows them in the community °  °[facility]  (866) 331-1348   °[employer]  (888) 892-1162   ° °Agencies that provide inexpensive medical care: °Organization         Address  Phone   Notes  °[facility]  (336) 832-8035   °[facility]    (336) 832-7272   °[facility] [address] °[place], [place] [address] (336) 832-4777   °[facility] [address], °[place] (336) 271-4999   °[facility]    (336) 373-0678   °[facility]    (336) 272-1050   °[facility] ° [address], [place] Phone:  (336) 832-4444, Fax:  (336) 832-4440 Hours of Operation:  9 am - 6 pm, M-F.  Also accepts Medicaid/Medicare and self-pay.  °[facility] ° [address], [place] Phone: (336) 832-3150, Fax: (336) [phone]. Hours of Operation:  8:30 am - 5:30 pm, M-F.  Also accepts Medicaid and self-pay.  °[facility] [place] [address], [place] Phone: (336) 878-6027   °[facility] [address], [place], [place] (336)723-1848, Ext. 123 Mondays & Thursdays: 7-9 AM.  First 15 patients are seen on a first come, first serve basis. °  ° °Medicaid-accepting [place] Providers: ° °Organization         Address  Phone   Notes  °[facility] [address], [place] (336) 641-2100 Also accepts self-pay patients.  °[facility] [address], [place] ° (336) 856-9996   °[facility] [address], [place]  (336) 288-8857   °[facility] [address] [place] [address], [place] (336) 299-7000   °Veita Bland [address], [place]  ° (336) 373-1557 Only accepts [place] Access Medicaid patients after they have their name applied to their card.  ° °Self-Pay (no insurance) in [place]: ° °Organization         Address  Phone   Notes  °Sickle Cell Patients, [facility] [address], [place] (336) 832-1970   °[facility] [address], [place] (336) 832-4400   °[facility] ° [address], [place] (336) 992-4800   °[facility]/Dr. Osei-Bonsu ° [address] [place] [address], [place] or [address], [place] (336) 841-8500 Phone number for both [place] and [place] locations is the same.  °[facility] [address], [place] (336) 299-0000   °[facility] [address] [place] [address], [place] or [address] (336) 852-7530 °(336) 878-2260   °[facility] [address], [place] (336) 350-1642, phone; (336) 294-5005, fax Sees patients 1st and 3rd Saturday of every month.  Must not qualify for public or private insurance (i.e. Medicaid, Medicare, [place] Health Choice, Veterans' Benefits) • Household income should be no more than 200% of the poverty level •The clinic cannot treat you if you are pregnant or think you are pregnant • Sexually transmitted diseases are not treated at the clinic.  ° ° °Dental Care: °Organization         Address  Phone  Notes  °[place] [facility] [address], [place] (336) 641-6152 Accepts children up to age 21 who are enrolled in Medicaid or [place] Health Choice; pregnant women with a Medicaid card; and children who have applied for Medicaid or [place] Health Choice, but were declined, whose parents can pay a reduced fee at time of service.  °[place]  [facility] [place]  [address], [place] (336) 641-7733 Accepts children up to age 21 who are enrolled in Medicaid or [place] Health Choice; pregnant women with a Medicaid card; and children who have applied for Medicaid or [place] Health Choice, but were declined, whose parents can pay a reduced fee at time of service.  °[facility] ° [address], [place] (336) 641-4533 Patients are seen by appointment only. Walk-ins are not accepted. [facility] will see patients 18 years of age and older. °Monday - Tuesday (8am-5pm) °Most Wednesdays (8:30-5pm) °$30 per visit, cash only  °[facility] ° [address], [place] (336) 641-4533 Patients are seen by appointment only. Walk-ins are not accepted. [facility] will see patients 18 years of age and older. °One   Wednesday Evening (Monthly: Volunteer Based).  $30 per visit, cash only  °[facility]  (919) 537-3737 for adults; Children under age 4, call Graduate Pediatric Dentistry at ([phone]) [phone]. Children aged 4-14, please call (919) [phone] to request a pediatric application. ° Dental services are provided in all areas of dental care including fillings, crowns and bridges, complete and partial dentures, implants, gum treatment, root canals, and extractions. Preventive care is also provided. Treatment is provided to both adults and children. °Patients are selected via a lottery and there is often a waiting list. °  °[facility] [address], °[place] ° (336) 763-8833 [email or web address] °  °[facility] [address], [place], [place] (336)723-1848, Ext. 123 Second and Fourth Thursday of each month, opens at 6:30 AM; Clinic ends at 9 AM.  Patients are seen on a first-come first-served basis, and a limited number are seen during each clinic.  ° °[facility] ° [address], [place], [place] (336) 723-7904    Eligibility Requirements °You must have lived in [place], [place], or [place] counties for at least the last three months. °  You cannot be eligible for state or federal sponsored healthcare insurance, including Veterans Administration, Medicaid, or Medicare. °  You generally cannot be eligible for healthcare insurance through your employer.  °  How to apply: °Eligibility screenings are held every Tuesday and Wednesday afternoon from 1:00 pm until 4:00 pm. You do not need an appointment for the interview!  °[facility] [address], [place], [place] 336-631-2330   °[facility]  336-342-8273   °[facility]  336-703-3100   °[facility]  336-570-6415   ° °Behavioral Health Resources in the Community: °Intensive Outpatient Programs °Organization         Address  Phone  Notes  °[facility] [address], [place], [place] 336-878-6098   °[facility] [address], [place], [place] 336-832-9800   °[facility] [address], [place], [place] ° 336-882-2125   °[facility] [address],  °[place], [place] 1-800-853-5163 or 336-641-4981   °Substance Abuse Resources °Organization         Address  Phone  Notes  °[facility]  336-882-2125   °[facility]  336-784-9470   °[facility]  336-285-9073   °[facility]  336-845-3988   °Residential & Outpatient Substance Abuse Program  1-800-659-3381   °Psychological Services °Organization         Address  Phone  Notes  °[facility]  336- 832-9600   °[facility]  336- 378-7881   °[facility] [address], [place] 1-800-853-5163 or 336-641-4981   ° °Mobile Crisis Teams °Organization         Address  Phone  Notes  °[facility], Mobile Crisis Care Unit  1-877-626-1772   °Assertive °[facility] ° [address]  [place], [place] 336-834-9664   °Sharon DeEsch [address] °[place] 336-554-5454   ° °Self-Help/Support Groups °Organization         Address  Phone             Notes  °[facility]  - variety of support groups  336- 373-1402 Call for more information  °Narcotics Anonymous (NA), [facility] [address], °[place]  2 meetings at this location  ° °  Residential Treatment Programs Organization         Address  Phone  Notes  [facility] [address],    [place]  [phone]   [facility]  [address], [place] [phone], [place], [place] [phone]   [facility] [address], [place] [phone] Admissions: 8am-3pm M-F  [facility] [address]    [place], [place] 295-284-1324([phone]   [facility] [address], [place], [place] [phone](225)[phone]   [facility] [address]  [place], [place] [phone]) [phone]   [facility] Intensive Outpatient [address], [place], [place] [phone]   [facility] [address]  [place], [place] [phone]-(825)081-2297 or [phone]   [facility] [address] [place], [place] 323-557-3220([phone]) [phone] Accepts Medicaid  [facility] [address]  [place] [phone] Substance Abuse/Addiction Treatment   [facility]         Address  Phone  Notes  [facility]  567-745-4452(888) [phone]   Angie FavaJulie Brannon, PhD [address], [address] [place], [place]   [phone](336) ([phone] or [phone](336) [phone]   [facility]   [address] [place], [place] [phone](336) [phone]   [facility] [address], [place], [place] (705)[phone](336) [phone] Insurance/Medicaid/sponsorship through [facility] [address]                                    [place], [place] [phone]) [phone] Therapy/tele-psych/case    [facility] [address]   [place], [place] [phone]) [phone]    Dr. Lolly MustacheArfeen  [phone](336) [phone]   [facility]  [employer] [facility] 1) [address], [place] 2) [address], [place] 3)  [address], [place] [phone](336) [phone](336) [phone]  [phone](336) [phone]   [facility] Child Abuse Hotline 252-320-7330(336) 959-219-6483 or [phone](336) [phone] (After Hours)      Take over the counter tylenol and ibuprofen (OR Excedrin), and benadryl, as directed on packaging, with the prescription given to you today, as needed for headache.  Keep a headache diary, as discussed.  Call your regular medical doctor tomorrow morning to schedule a follow up appointment within the next 3  days.  Return to the Emergency Department immediately sooner if worsening.

## 2015-04-03 NOTE — ED Notes (Signed)
Pt reports had headache x 1 week and was evaluated here Sunday.  Pt says was given a shot and went home.  Pt says felt better until headache came back at 2pm yesterday.  Denies n/v.  Denies fever or neck pain.

## 2015-04-03 NOTE — ED Notes (Signed)
MD at bedside. 

## 2015-06-05 ENCOUNTER — Emergency Department (HOSPITAL_COMMUNITY)
Admission: EM | Admit: 2015-06-05 | Discharge: 2015-06-06 | Disposition: A | Discharge status: Home / Self Care | Payer: Medicaid Other | Attending: Emergency Medicine | Admitting: Emergency Medicine

## 2015-06-05 ENCOUNTER — Encounter (HOSPITAL_COMMUNITY): Payer: Self-pay | Admitting: Emergency Medicine

## 2015-06-05 ENCOUNTER — Emergency Department (HOSPITAL_COMMUNITY): Payer: Medicaid Other

## 2015-06-05 DIAGNOSIS — R062 Wheezing: Secondary | ICD-10-CM | POA: Insufficient documentation

## 2015-06-05 DIAGNOSIS — G8929 Other chronic pain: Secondary | ICD-10-CM | POA: Diagnosis not present

## 2015-06-05 DIAGNOSIS — R591 Generalized enlarged lymph nodes: Secondary | ICD-10-CM | POA: Insufficient documentation

## 2015-06-05 DIAGNOSIS — Z720 Tobacco use: Secondary | ICD-10-CM | POA: Diagnosis not present

## 2015-06-05 DIAGNOSIS — K5732 Diverticulitis of large intestine without perforation or abscess without bleeding: Secondary | ICD-10-CM | POA: Diagnosis not present

## 2015-06-05 DIAGNOSIS — R05 Cough: Secondary | ICD-10-CM | POA: Insufficient documentation

## 2015-06-05 DIAGNOSIS — R590 Localized enlarged lymph nodes: Secondary | ICD-10-CM

## 2015-06-05 DIAGNOSIS — R1030 Lower abdominal pain, unspecified: Secondary | ICD-10-CM | POA: Diagnosis present

## 2015-06-05 MED ORDER — ONDANSETRON HCL 4 MG/2ML IJ SOLN
4.0000 mg | Freq: Once | INTRAMUSCULAR | Status: AC
  Administered 2015-06-06: 4 mg via INTRAVENOUS
  Filled 2015-06-05: qty 2

## 2015-06-05 MED ORDER — IOHEXOL 300 MG/ML  SOLN
25.0000 mL | Freq: Once | INTRAMUSCULAR | Status: AC | PRN
  Administered 2015-06-05: 25 mL via ORAL

## 2015-06-05 MED ORDER — FENTANYL CITRATE (PF) 100 MCG/2ML IJ SOLN
50.0000 ug | Freq: Once | INTRAMUSCULAR | Status: AC
  Administered 2015-06-06: 50 ug via INTRAVENOUS
  Filled 2015-06-05: qty 2

## 2015-06-05 MED ORDER — SODIUM CHLORIDE 0.9 % IV BOLUS (SEPSIS)
1000.0000 mL | Freq: Once | INTRAVENOUS | Status: AC
  Administered 2015-06-06: 1000 mL via INTRAVENOUS

## 2015-06-05 NOTE — ED Notes (Signed)
Pt. Reports that he is unable to provide urine sample at this time.

## 2015-06-05 NOTE — ED Provider Notes (Signed)
CSN: [id number]     Arrival date & time 06/05/15  2147 History  This chart was scribed for Devoria Albe, MD by Octavia Heir, ED Scribe. This patient was seen in room APA19/APA19 and the patient's care was started at 11:04 PM.    Chief Complaint  Patient presents with  . Abdominal Pain     The history is provided by the patient. No language interpreter was used.    HPI Comments: Cory [name] is a 34 y.o. male who presents to the Emergency Department complaining of constant, gradual worsening lower abdominal pain that radiates to his groin onset yesterday. Pt notes it is a sharp, stabbing, throbbing pain in his abdomen. The pain was intermittent but has been constant today.  He has an intermittent cough that has been occurring "for awhile". He states his pain is increased when he coughs and with any kind of movement, including hitting bumps while riding in a car. Pt reports minimal relief while lying down. Pt denies having these symptoms before and notes no changes in daily activity that could've caused the pain. He has been eating normally. Pt denies nausea, vomiting, diarrhea, and fever.  He further denies any abdominal surgeries. Pt smokes about one pack a day.   PCP none  Past Medical History  Diagnosis Date  . Low back pain   . Chronic headache    History reviewed. No pertinent past surgical history. No family history on file. History  Substance Use Topics  . Smoking status: Current Every Day Smoker -- 0.50 packs/day    Types: Cigarettes  . Smokeless tobacco: Not on file  . Alcohol Use: No  employed Education officer, museum work0 Smokes 1 ppd  Review of Systems  Constitutional: Negative for fever.  Respiratory: Positive for cough.   Gastrointestinal: Positive for abdominal pain. Negative for nausea, vomiting and diarrhea.  All other systems reviewed and are negative.     Allergies  Ultram  Home Medications   Prior to Admission medications   Medication Sig Start Date End Date Taking?  Authorizing Provider  ciprofloxacin (CIPRO) 500 MG tablet Take 1 tablet (500 mg total) by mouth 2 (two) times daily. 06/06/15   Devoria Albe, MD  HYDROcodone-acetaminophen (NORCO/VICODIN) 5-325 MG per tablet Take 1 tablet by mouth every 6 (six) hours as needed for moderate pain. 06/06/15   Devoria Albe, MD  metroNIDAZOLE (FLAGYL) 500 MG tablet Take 1 tablet (500 mg total) by mouth 3 (three) times daily. 06/06/15   Devoria Albe, MD   Triage vitals: BP 120/69 mmHg  Pulse 87  Temp(Src) 98.3 F (36.8 C) (Oral)  Resp 20  Ht 6' (1.829 m)  Wt 285 lb (129.275 kg)  BMI 38.64 kg/m2  SpO2 100%  Vital signs normal   Physical Exam  Constitutional: He is oriented to person, place, and time. He appears well-developed and well-nourished.  Non-toxic appearance. He does not appear ill. No distress.  Asleep when we entered the room  HENT:  Head: Normocephalic and atraumatic.  Right Ear: External ear normal.  Left Ear: External ear normal.  Nose: Nose normal. No mucosal edema or rhinorrhea.  Mouth/Throat: Oropharynx is clear and moist and mucous membranes are normal. No dental abscesses or uvula swelling.  Prominent tonsils  Eyes: Conjunctivae and EOM are normal. Pupils are equal, round, and reactive to light.  Neck: Normal range of motion and full passive range of motion without pain. Neck supple.  Cardiovascular: Normal rate, regular rhythm and normal heart sounds.  Exam reveals no  gallop and no friction rub.   No murmur heard. Pulmonary/Chest: Effort normal. No respiratory distress. He has wheezes. He has no rhonchi. He has no rales. He exhibits no tenderness and no crepitus.  Diffuse wheezing and rhonchi, patient is aware of it, denies having acute bronchitis, states he has a chronic cough. Advised to quit smoking  Abdominal: Soft. Normal appearance and bowel sounds are normal. He exhibits no distension. There is no tenderness. There is no rebound and no guarding.    Diffuse pain bilaterally from umbilicus  down and most tender on suprapubic area  Genitourinary:  No CVA tenderness  Musculoskeletal: Normal range of motion. He exhibits no edema or tenderness.  Moves all extremities well.   Neurological: He is alert and oriented to person, place, and time. He has normal strength. No cranial nerve deficit.  Skin: Skin is warm, dry and intact. No rash noted. No erythema. No pallor.  Psychiatric: He has a normal mood and affect. His speech is normal and behavior is normal. His mood appears not anxious.  Nursing note and vitals reviewed.   ED Course  Procedures   Medications  sodium chloride 0.9 % bolus 1,000 mL (0 mLs Intravenous Stopped 06/06/15 0141)  ondansetron (ZOFRAN) injection 4 mg (4 mg Intravenous Given 06/06/15 0001)  fentaNYL (SUBLIMAZE) injection 50 mcg (50 mcg Intravenous Given 06/06/15 0001)  iohexol (OMNIPAQUE) 300 MG/ML solution 25 mL (25 mLs Oral Contrast Given 06/05/15 2324)  iohexol (OMNIPAQUE) 300 MG/ML solution 100 mL (100 mLs Intravenous Contrast Given 06/06/15 0007)  ciprofloxacin (CIPRO) IVPB 400 mg (0 mg Intravenous Stopped 06/06/15 0313)  metroNIDAZOLE (FLAGYL) IVPB 500 mg (0 mg Intravenous Stopped 06/06/15 0429)  fentaNYL (SUBLIMAZE) injection 50 mcg (50 mcg Intravenous Given 06/06/15 0231)    DIAGNOSTIC STUDIES: Oxygen Saturation is 100% on RA, normal by my interpretation.  COORDINATION OF CARE:  11:11 PM Discussed treatment plan which includes urinalysis, zofran, CT abdomen pelvis w contrast, IV zofran, sublimaze, with pt at bedside and pt agreed to plan.  Pt started on IV antibiotics for his diverticulitis. We discussed follow up with gastroenterology and also a need to get follow up for the enlarged lymph nodes in his right abdomen. He has not had any infections or injury to his RLE, and denies any pain or swelling in his RLE. Pt does not want to be admitted. We discussed treatment at home including oral fluids the next 1-2 days.   Labs Review Results for orders placed  or performed during the hospital encounter of 06/05/15  Urinalysis, Routine w reflex microscopic (not at [facility])  Result Value Ref Range   Color, Urine YELLOW YELLOW   APPearance CLEAR CLEAR   Specific Gravity, Urine 1.010 1.005 - 1.030   pH 7.0 5.0 - 8.0   Glucose, UA NEGATIVE NEGATIVE mg/dL   Hgb urine dipstick NEGATIVE NEGATIVE   Bilirubin Urine NEGATIVE NEGATIVE   Ketones, ur NEGATIVE NEGATIVE mg/dL   Protein, ur NEGATIVE NEGATIVE mg/dL   Urobilinogen, UA 0.2 0.0 - 1.0 mg/dL   Nitrite NEGATIVE NEGATIVE   Leukocytes, UA NEGATIVE NEGATIVE  Comprehensive metabolic panel  Result Value Ref Range   Sodium 138 135 - 145 mmol/L   Potassium 3.4 (L) 3.5 - 5.1 mmol/L   Chloride 104 101 - 111 mmol/L   CO2 25 22 - 32 mmol/L   Glucose, Bld 119 (H) 65 - 99 mg/dL   BUN 9 6 - 20 mg/dL   Creatinine, Ser 4.17 0.61 - 1.24 mg/dL   Calcium  8.4 (L) 8.9 - 10.3 mg/dL   Total Protein 7.1 6.5 - 8.1 g/dL   Albumin 3.9 3.5 - 5.0 g/dL   AST 18 15 - 41 U/L   ALT 27 17 - 63 U/L   Alkaline Phosphatase 73 38 - 126 U/L   Total Bilirubin 0.5 0.3 - 1.2 mg/dL   GFR calc non Af Amer >60 >60 mL/min   GFR calc Af Amer >60 >60 mL/min   Anion gap 9 5 - 15  CBC with Differential  Result Value Ref Range   WBC 12.0 (H) 4.0 - 10.5 K/uL   RBC 4.25 4.22 - 5.81 MIL/uL   Hemoglobin 12.7 (L) 13.0 - 17.0 g/dL   HCT 16.1 (L) 09.6 - 04.5 %   MCV 87.3 78.0 - 100.0 fL   MCH 29.9 26.0 - 34.0 pg   MCHC 34.2 30.0 - 36.0 g/dL   RDW 40.9 81.1 - 91.4 %   Platelets 215 150 - 400 K/uL   Neutrophils Relative % 57 43 - 77 %   Neutro Abs 6.9 1.7 - 7.7 K/uL   Lymphocytes Relative 28 12 - 46 %   Lymphs Abs 3.4 0.7 - 4.0 K/uL   Monocytes Relative 10 3 - 12 %   Monocytes Absolute 1.2 (H) 0.1 - 1.0 K/uL   Eosinophils Relative 4 0 - 5 %   Eosinophils Absolute 0.5 0.0 - 0.7 K/uL   Basophils Relative 1 0 - 1 %   Basophils Absolute 0.1 0.0 - 0.1 K/uL   Laboratory interpretation all normal except mild anemia,  leukocytosis     Imaging Review Ct Abdomen Pelvis W Contrast  06/06/2015   CLINICAL DATA:  Patient with gradual worsening lower abdominal pain.  EXAM: CT ABDOMEN AND PELVIS WITH CONTRAST  TECHNIQUE: Multidetector CT imaging of the abdomen and pelvis was performed using the standard protocol following bolus administration of intravenous contrast.  CONTRAST:  25mL OMNIPAQUE IOHEXOL 300 MG/ML SOLN, OMNIPAQUE IOHEXOL 300 MG/ML SOLN  COMPARISON:  None.  FINDINGS: Lower chest: No consolidative pulmonary opacities. There is a 7 mm right lower lobe pulmonary nodule (image 10; series 2). No pleural effusion.  Hepatobiliary: Liver is diffusely low in attenuation compatible with hepatic steatosis. Gallbladder is decompressed. No intrahepatic or extrahepatic biliary ductal dilatation.  Pancreas: Unremarkable  Spleen: Unremarkable  Adrenals/Urinary Tract: The right adrenal gland is unremarkable. Indeterminate 1.6 cm nodule within the left adrenal gland. Kidneys enhance symmetrically with contrast. No hydronephrosis. Urinary bladder is unremarkable.  Stomach/Bowel: Circumferential wall thickening and surrounding fat stranding about the sigmoid colon. Small amount of adjacent fluid. The appendix is normal. No evidence for bowel obstruction. No free intraperitoneal air.  Vascular/Lymphatic: Normal caliber abdominal aorta. Prominent right pelvic sidewall lymph node measuring 1.9 cm (image 78; series 2). Additionally there are multiple enlarged right inguinal lymph nodes measuring up to 2.8 cm (image 96; series 2).  Other: None  Musculoskeletal: No aggressive or acute appearing osseous lesions.  IMPRESSION: Circumferential wall thickening and surrounding fat stranding involving the sigmoid colon most compatible with diverticulitis. If not previously performed, recommend correlation with colonoscopy in the non acute setting to exclude the possibility of underlying mass.  Additionally there are bulky enlarged right  inguinal and pelvic lymph nodes concerning for neoplastic process, potentially right lower extremity malignancy versus lymphoproliferative disorder.  7 mm right lower lobe pulmonary nodule, nonspecific in light of pelvic and inguinal adenopathy raising the possibility of neoplastic process. Recommend attention on additional imaging/evaluation.  Indeterminate left adrenal  nodule. Recommend dedicated evaluation with pre and post contrast enhanced MRI.   Electronically Signed   By: Annia Belt M.D.   On: 06/06/2015 01:33     EKG Interpretation None      MDM   Final diagnoses:  Diverticulitis of large intestine without perforation or abscess without bleeding  Intra-abdominal lymphadenopathy   Discharge Medication List as of 06/06/2015  3:49 AM    START taking these medications   Details  ciprofloxacin (CIPRO) 500 MG tablet Take 1 tablet (500 mg total) by mouth 2 (two) times daily., Starting 06/06/2015, Until Discontinued, Print    HYDROcodone-acetaminophen (NORCO/VICODIN) 5-325 MG per tablet Take 1 tablet by mouth every 6 (six) hours as needed for moderate pain., Starting 06/06/2015, Until Discontinued, Print    metroNIDAZOLE (FLAGYL) 500 MG tablet Take 1 tablet (500 mg total) by mouth 3 (three) times daily., Starting 06/06/2015, Until Discontinued, Print        Plan discharge  Devoria Albe, MD, FACEP   I personally performed the services described in this documentation, which was scribed in my presence. The recorded information has been reviewed and considered.  Devoria Albe, MD, Concha Pyo, MD 06/06/15 [phone]

## 2015-06-05 NOTE — ED Notes (Signed)
Pt c/o lower abd pain that radiates to his groin

## 2015-06-05 NOTE — ED Notes (Signed)
Dr.Knapp at bedside  

## 2015-06-05 NOTE — ED Notes (Signed)
Pt. Reports lower abdominal pain radiating to groin area. Pt. Reports sharp pains with urination. Pt. Denies nausea/vomiting/diarrhea.

## 2015-06-06 ENCOUNTER — Encounter (HOSPITAL_COMMUNITY): Payer: Self-pay | Admitting: Radiology

## 2015-06-06 LAB — CBC WITH DIFFERENTIAL/PLATELET
BASOS PCT: 1 % (ref 0–1)
Basophils Absolute: 0.1 10*3/uL (ref 0.0–0.1)
EOS ABS: 0.5 10*3/uL (ref 0.0–0.7)
Eosinophils Relative: 4 % (ref 0–5)
HEMATOCRIT: 37.1 % — AB (ref 39.0–52.0)
HEMOGLOBIN: 12.7 g/dL — AB (ref 13.0–17.0)
Lymphocytes Relative: 28 % (ref 12–46)
Lymphs Abs: 3.4 10*3/uL (ref 0.7–4.0)
MCH: 29.9 pg (ref 26.0–34.0)
MCHC: 34.2 g/dL (ref 30.0–36.0)
MCV: 87.3 fL (ref 78.0–100.0)
MONOS PCT: 10 % (ref 3–12)
Monocytes Absolute: 1.2 10*3/uL — ABNORMAL HIGH (ref 0.1–1.0)
Neutro Abs: 6.9 10*3/uL (ref 1.7–7.7)
Neutrophils Relative %: 57 % (ref 43–77)
Platelets: 215 10*3/uL (ref 150–400)
RBC: 4.25 MIL/uL (ref 4.22–5.81)
RDW: 13.2 % (ref 11.5–15.5)
WBC: 12 10*3/uL — ABNORMAL HIGH (ref 4.0–10.5)

## 2015-06-06 LAB — COMPREHENSIVE METABOLIC PANEL
ALT: 27 U/L (ref 17–63)
ANION GAP: 9 (ref 5–15)
AST: 18 U/L (ref 15–41)
Albumin: 3.9 g/dL (ref 3.5–5.0)
Alkaline Phosphatase: 73 U/L (ref 38–126)
BUN: 9 mg/dL (ref 6–20)
CALCIUM: 8.4 mg/dL — AB (ref 8.9–10.3)
CO2: 25 mmol/L (ref 22–32)
CREATININE: 0.67 mg/dL (ref 0.61–1.24)
Chloride: 104 mmol/L (ref 101–111)
GFR calc Af Amer: >60 mL/min (ref >60)
GFR calc non Af Amer: >60 mL/min (ref >60)
Glucose, Bld: 119 mg/dL — ABNORMAL HIGH (ref 65–99)
Potassium: 3.4 mmol/L — ABNORMAL LOW (ref 3.5–5.1)
Sodium: 138 mmol/L (ref 135–145)
TOTAL PROTEIN: 7.1 g/dL (ref 6.5–8.1)
Total Bilirubin: 0.5 mg/dL (ref 0.3–1.2)

## 2015-06-06 LAB — URINALYSIS, ROUTINE W REFLEX MICROSCOPIC
Bilirubin Urine: NEGATIVE
Glucose, UA: NEGATIVE mg/dL
Hgb urine dipstick: NEGATIVE
Ketones, ur: NEGATIVE mg/dL
Leukocytes, UA: NEGATIVE
Nitrite: NEGATIVE
PROTEIN: NEGATIVE mg/dL
Specific Gravity, Urine: 1.01 (ref 1.005–1.030)
UROBILINOGEN UA: 0.2 mg/dL (ref 0.0–1.0)
pH: 7 (ref 5.0–8.0)

## 2015-06-06 MED ORDER — CIPROFLOXACIN HCL 500 MG PO TABS: 500.0000 mg | ORAL_TABLET | Freq: Two times a day (BID) | ORAL | Status: DC

## 2015-06-06 MED ORDER — CIPROFLOXACIN IN D5W 400 MG/200ML IV SOLN
400.0000 mg | Freq: Once | INTRAVENOUS | Status: AC
  Administered 2015-06-06: 400 mg via INTRAVENOUS
  Filled 2015-06-06: qty 200

## 2015-06-06 MED ORDER — METRONIDAZOLE IN NACL 5-0.79 MG/ML-% IV SOLN
500.0000 mg | Freq: Once | INTRAVENOUS | Status: AC
  Administered 2015-06-06: 500 mg via INTRAVENOUS
  Filled 2015-06-06: qty 100

## 2015-06-06 MED ORDER — METRONIDAZOLE 500 MG PO TABS: 500.0000 mg | ORAL_TABLET | Freq: Three times a day (TID) | ORAL | Status: DC

## 2015-06-06 MED ORDER — HYDROCODONE-ACETAMINOPHEN 5-325 MG PO TABS: 1.0000 | ORAL_TABLET | Freq: Four times a day (QID) | ORAL | Status: DC | PRN

## 2015-06-06 MED ORDER — FENTANYL CITRATE (PF) 100 MCG/2ML IJ SOLN
50.0000 ug | Freq: Once | INTRAMUSCULAR | Status: AC
Start: 2015-06-06 — End: 2015-06-06
  Administered 2015-06-06: 50 ug via INTRAVENOUS
  Filled 2015-06-06: qty 2

## 2015-06-06 MED ORDER — IOHEXOL 300 MG/ML  SOLN
100.0000 mL | Freq: Once | INTRAMUSCULAR | Status: AC | PRN
  Administered 2015-06-06: 100 mL via INTRAVENOUS

## 2015-06-06 NOTE — Discharge Instructions (Signed)
No food for the next 48 hours, you can have liquids today and tomorrow you can start bland diet, if you are doing better, such as jello, lipton's soup, crackers. Advance your diet slowly over the next week. Take the antibiotics until gone. Call Dr Luvenia Starch office to get an appointment to discuss the inflammation of your intestines and the enlarged lymph nodes in your abdomen.  Return to the ED if you get a fever, have uncontrolled pain or start vomiting and be prepared to be admitted.     Diverticulitis Diverticulitis is when small pockets that have formed in your colon (large intestine) become infected or swollen. HOME CARE  Follow your doctor's instructions.  Follow a special diet if told by your doctor.  When you feel better, your doctor may tell you to change your diet. You may be told to eat a lot of fiber. Fruits and vegetables are good sources of fiber. Fiber makes it easier to poop (have bowel movements).  Take supplements or probiotics as told by your doctor.  Only take medicines as told by your doctor.  Keep all follow-up visits with your doctor. GET HELP IF:  Your pain does not get better.  You have a hard time eating food.  You are not pooping like normal. GET HELP RIGHT AWAY IF:  Your pain gets worse.  Your problems do not get better.  Your problems suddenly get worse.  You have a fever.  You keep throwing up (vomiting).  You have bloody or black, tarry poop (stool). MAKE SURE YOU:   Understand these instructions.  Will watch your condition.  Will get help right away if you are not doing well or get worse. Document Released: 05/18/2008 Document Revised: 12/05/2013 Document Reviewed: 10/25/2013 [facility] Patient Information 2015 [place], [place]. This information is not intended to replace advice given to you by your health care provider. Make sure you discuss any questions you have with your health care provider.  Lymphadenopathy Lymphadenopathy means  "disease of the lymph glands." But the term is usually used to describe swollen or enlarged lymph glands, also called lymph nodes. These are the bean-shaped organs found in many locations including the neck, underarm, and groin. Lymph glands are part of the immune system, which fights infections in your body. Lymphadenopathy can occur in just one area of the body, such as the neck, or it can be generalized, with lymph node enlargement in several areas. The nodes found in the neck are the most common sites of lymphadenopathy. CAUSES When your immune system responds to germs (such as viruses or bacteria ), infection-fighting cells and fluid build up. This causes the glands to grow in size. Usually, this is not something to worry about. Sometimes, the glands themselves can become infected and inflamed. This is called lymphadenitis. Enlarged lymph nodes can be caused by many diseases:  Bacterial disease, such as strep throat or a skin infection.  Viral disease, such as a common cold.  Other germs, such as Lyme disease, tuberculosis, or sexually transmitted diseases.  Cancers, such as lymphoma (cancer of the lymphatic system) or leukemia (cancer of the white blood cells).  Inflammatory diseases such as lupus or rheumatoid arthritis.  Reactions to medications. Many of the diseases above are rare, but important. This is why you should see your caregiver if you have lymphadenopathy. SYMPTOMS  Swollen, enlarged lumps in the neck, back of the head, or other locations.  Tenderness.  Warmth or redness of the skin over the lymph nodes.  Fever. DIAGNOSIS Enlarged lymph nodes are often near the source of infection. They can help health care providers diagnose your illness. For instance:  Swollen lymph nodes around the jaw might be caused by an infection in the mouth.  Enlarged glands in the neck often signal a throat infection.  Lymph nodes that are swollen in more than one area often indicate an  illness caused by a virus. Your caregiver will likely know what is causing your lymphadenopathy after listening to your history and examining you. Blood tests, x-rays, or other tests may be needed. If the cause of the enlarged lymph node cannot be found, and it does not go away by itself, then a biopsy may be needed. Your caregiver will discuss this with you. TREATMENT Treatment for your enlarged lymph nodes will depend on the cause. Many times the nodes will shrink to normal size by themselves, with no treatment. Antibiotics or other medicines may be needed for infection. Only take over-the-counter or prescription medicines for pain, discomfort, or fever as directed by your caregiver. HOME CARE INSTRUCTIONS Swollen lymph glands usually return to normal when the underlying medical condition goes away. If they persist, contact your health-care provider. He/she might prescribe antibiotics or other treatments, depending on the diagnosis. Take any medications exactly as prescribed. Keep any follow-up appointments made to check on the condition of your enlarged nodes. SEEK MEDICAL CARE IF:  Swelling lasts for more than two weeks.  You have symptoms such as weight loss, night sweats, fatigue, or fever that does not go away.  The lymph nodes are hard, seem fixed to the skin, or are growing rapidly.  Skin over the lymph nodes is red and inflamed. This could mean there is an infection. SEEK IMMEDIATE MEDICAL CARE IF:  Fluid starts leaking from the area of the enlarged lymph node.  You develop a fever of 102 F (38.9 C) or greater.  Severe pain develops (not necessarily at the site of a large lymph node).  You develop chest pain or shortness of breath.  You develop worsening abdominal pain. MAKE SURE YOU:  Understand these instructions.  Will watch your condition.  Will get help right away if you are not doing well or get worse. Document Released: 09/08/2008 Document Revised: 04/16/2014  Document Reviewed: 09/08/2008 [facility] Patient Information 2015 [place], [place]. This information is not intended to replace advice given to you by your health care provider. Make sure you discuss any questions you have with your health care provider.

## 2015-06-26 ENCOUNTER — Encounter (HOSPITAL_COMMUNITY): Payer: Self-pay | Admitting: Emergency Medicine

## 2015-06-26 ENCOUNTER — Emergency Department (HOSPITAL_COMMUNITY)
Admission: EM | Admit: 2015-06-26 | Discharge: 2015-06-26 | Disposition: A | Discharge status: Home / Self Care | Payer: Medicaid Other | Attending: Emergency Medicine | Admitting: Emergency Medicine

## 2015-06-26 DIAGNOSIS — Y9289 Other specified places as the place of occurrence of the external cause: Secondary | ICD-10-CM | POA: Insufficient documentation

## 2015-06-26 DIAGNOSIS — Y990 Civilian activity done for income or pay: Secondary | ICD-10-CM | POA: Diagnosis not present

## 2015-06-26 DIAGNOSIS — S3992XA Unspecified injury of lower back, initial encounter: Secondary | ICD-10-CM | POA: Diagnosis present

## 2015-06-26 DIAGNOSIS — S39012A Strain of muscle, fascia and tendon of lower back, initial encounter: Secondary | ICD-10-CM

## 2015-06-26 DIAGNOSIS — X58XXXA Exposure to other specified factors, initial encounter: Secondary | ICD-10-CM | POA: Diagnosis not present

## 2015-06-26 DIAGNOSIS — G8929 Other chronic pain: Secondary | ICD-10-CM | POA: Insufficient documentation

## 2015-06-26 DIAGNOSIS — Z720 Tobacco use: Secondary | ICD-10-CM | POA: Insufficient documentation

## 2015-06-26 DIAGNOSIS — Y9389 Activity, other specified: Secondary | ICD-10-CM | POA: Insufficient documentation

## 2015-06-26 MED ORDER — OXYCODONE-ACETAMINOPHEN 5-325 MG PO TABS: 1.0000 | ORAL_TABLET | ORAL | Status: DC | PRN

## 2015-06-26 MED ORDER — OXYCODONE-ACETAMINOPHEN 5-325 MG PO TABS
2.0000 | ORAL_TABLET | Freq: Once | ORAL | Status: AC
Start: 2015-06-26 — End: 2015-06-26
  Administered 2015-06-26: 2 via ORAL
  Filled 2015-06-26: qty 2

## 2015-06-26 MED ORDER — CYCLOBENZAPRINE HCL 10 MG PO TABS
10.0000 mg | ORAL_TABLET | Freq: Three times a day (TID) | ORAL | Status: DC | PRN
Start: 2015-06-26 — End: 2015-10-27

## 2015-06-26 MED ORDER — IBUPROFEN 800 MG PO TABS
800.0000 mg | ORAL_TABLET | Freq: Once | ORAL | Status: AC
  Administered 2015-06-26: 800 mg via ORAL
  Filled 2015-06-26: qty 1

## 2015-06-26 MED ORDER — IBUPROFEN 800 MG PO TABS: 800.0000 mg | ORAL_TABLET | Freq: Three times a day (TID) | ORAL | Status: DC

## 2015-06-26 NOTE — ED Provider Notes (Signed)
CSN: [id number]     Arrival date & time 06/26/15  1926 History   First MD Initiated Contact with Patient 06/26/15 1950     Chief Complaint  Patient presents with  . Back Pain     (Consider location/radiation/quality/duration/timing/severity/associated sxs/prior Treatment) HPI   Cory [name] is a 34 y.o. male who presents to the Emergency Department complaining of sudden onset of low back pain that began yesterday while working.  He reports pain was mild initially, but worsened today at work.  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  Pain is worse with movement and improves slightly at rest.  He took ASA yesterday w/o relief.  He denies urine or bowel changes, abdominal pain, vomiting, numbness or weakness of the lower extremities.  Pain is similar to previous.     Past Medical History  Diagnosis Date  . Low back pain   . Chronic headache    History reviewed. No pertinent past surgical history. History reviewed. No pertinent family history. History  Substance Use Topics  . Smoking status: Current Every Day Smoker -- 0.50 packs/day    Types: Cigarettes  . Smokeless tobacco: Not on file  . Alcohol Use: No    Review of Systems  Constitutional: Negative for fever.  Respiratory: Negative for shortness of breath.   Gastrointestinal: Negative for vomiting, abdominal pain and constipation.  Genitourinary: Negative for dysuria, hematuria, flank pain, decreased urine volume and difficulty urinating.  Musculoskeletal: Positive for back pain. Negative for joint swelling.  Skin: Negative for rash.  Neurological: Negative for weakness and numbness.  All other systems reviewed and are negative.     Allergies  Ultram  Home Medications   Prior to Admission medications   Medication Sig Start Date End Date Taking? Authorizing Provider  ciprofloxacin (CIPRO) 500 MG tablet Take 1 tablet (500 mg total) by mouth 2 (two) times daily. Patient not  taking: Reported on 06/26/2015 06/06/15   Devoria Albe, MD  HYDROcodone-acetaminophen (NORCO/VICODIN) 5-325 MG per tablet Take 1 tablet by mouth every 6 (six) hours as needed for moderate pain. Patient not taking: Reported on 06/26/2015 06/06/15   Devoria Albe, MD  metroNIDAZOLE (FLAGYL) 500 MG tablet Take 1 tablet (500 mg total) by mouth 3 (three) times daily. Patient not taking: Reported on 06/26/2015 06/06/15   Devoria Albe, MD   BP 108/52 mmHg  Pulse 78  Temp(Src) 98.1 F (36.7 C) (Oral)  Resp 20  Ht 6' (1.829 m)  Wt 280 lb (127.007 kg)  BMI 37.97 kg/m2  SpO2 100% Physical Exam  Constitutional: He is oriented to person, place, and time. He appears well-developed and well-nourished. No distress.  HENT:  Head: Normocephalic and atraumatic.  Neck: Normal range of motion. Neck supple.  Cardiovascular: Normal rate, regular rhythm, normal heart sounds and intact distal pulses.   No murmur heard. Pulmonary/Chest: Effort normal and breath sounds normal. No respiratory distress.  Abdominal: Soft. He exhibits no distension. There is no tenderness.  Musculoskeletal: He exhibits tenderness. He exhibits no edema.       Lumbar back: He exhibits tenderness and pain. He exhibits normal range of motion, no swelling, no deformity, no laceration and normal pulse.  ttp of the bilateral lumbar paraspinal muscles.  No spinal tenderness.  DP pulses are brisk and symmetrical.  Distal sensation intact.  Hip Flexors/Extensors are intact.  Pt has 5/5 strength against resistance of bilateral lower extremities.     Neurological: He is alert and oriented  to person, place, and time. He has normal strength. No sensory deficit. He exhibits normal muscle tone. Coordination and gait normal.  Reflex Scores:      Patellar reflexes are 2+ on the right side and 2+ on the left side.      Achilles reflexes are 2+ on the right side and 2+ on the left side. Skin: Skin is warm and dry. No rash noted.  Nursing note and vitals  reviewed.   ED Course  Procedures (including critical care time) Labs Review Labs Reviewed - No data to display  Imaging Review No results found.   EKG Interpretation None      MDM   Final diagnoses:  Lumbosacral strain, initial encounter    Pt ambulated to the restroom with an antalgic gait.  No focal neuro deficits on exam.  Likely muscular injury.  No concerning sx's for emergent neurological or infectious process.  He agrees to sx's tx and close f/u if not improving.      Pauline Ausammy Ashir Kunz, PA-C 06/28/15 1656  Donnetta HutchingBrian Cook, MD 06/29/15 [phone]

## 2015-06-26 NOTE — Discharge Instructions (Signed)

## 2015-06-26 NOTE — ED Notes (Signed)
Pt c/o lower back pain since yesterday 

## 2015-07-01 ENCOUNTER — Telehealth: Payer: Self-pay | Admitting: Gastroenterology

## 2015-07-01 ENCOUNTER — Ambulatory Visit: Payer: Medicaid Other | Admitting: Gastroenterology

## 2015-07-01 ENCOUNTER — Encounter: Payer: Self-pay | Admitting: Gastroenterology

## 2015-07-01 NOTE — Telephone Encounter (Signed)
PATIENT WAS A NO SHOW AND LETTER WAS SENT  °

## 2015-07-30 ENCOUNTER — Encounter: Payer: Self-pay | Admitting: Gastroenterology

## 2015-07-30 ENCOUNTER — Ambulatory Visit: Payer: Medicaid Other | Admitting: Gastroenterology

## 2015-07-30 ENCOUNTER — Telehealth: Payer: Self-pay | Admitting: Gastroenterology

## 2015-07-30 NOTE — Telephone Encounter (Signed)
PATIENT WAS A NO SHOW AND LETTER SENT  °

## 2015-10-27 ENCOUNTER — Emergency Department (HOSPITAL_COMMUNITY)
Admission: EM | Admit: 2015-10-27 | Discharge: 2015-10-27 | Disposition: A | Discharge status: Home / Self Care | Payer: Medicaid Other | Attending: Emergency Medicine | Admitting: Emergency Medicine

## 2015-10-27 ENCOUNTER — Encounter (HOSPITAL_COMMUNITY): Payer: Self-pay | Admitting: *Deleted

## 2015-10-27 ENCOUNTER — Emergency Department (HOSPITAL_COMMUNITY): Payer: Medicaid Other

## 2015-10-27 DIAGNOSIS — F17210 Nicotine dependence, cigarettes, uncomplicated: Secondary | ICD-10-CM | POA: Insufficient documentation

## 2015-10-27 DIAGNOSIS — Z791 Long term (current) use of non-steroidal anti-inflammatories (NSAID): Secondary | ICD-10-CM | POA: Insufficient documentation

## 2015-10-27 DIAGNOSIS — G8929 Other chronic pain: Secondary | ICD-10-CM | POA: Diagnosis not present

## 2015-10-27 DIAGNOSIS — M25511 Pain in right shoulder: Secondary | ICD-10-CM | POA: Diagnosis not present

## 2015-10-27 MED ORDER — OXYCODONE-ACETAMINOPHEN 5-325 MG PO TABS: 1.0000 | ORAL_TABLET | ORAL | Status: DC | PRN

## 2015-10-27 MED ORDER — OXYCODONE-ACETAMINOPHEN 5-325 MG PO TABS
2.0000 | ORAL_TABLET | Freq: Once | ORAL | Status: AC
  Administered 2015-10-27: 2 via ORAL
  Filled 2015-10-27: qty 2

## 2015-10-27 MED ORDER — PREDNISONE 20 MG PO TABS: 20.0000 mg | ORAL_TABLET | Freq: Two times a day (BID) | ORAL | Status: DC

## 2015-10-27 NOTE — ED Provider Notes (Signed)
CSN: [id number]     Arrival date & time 10/27/15  1225 History   None    Chief Complaint  Patient presents with  . Shoulder Injury     (Consider location/radiation/quality/duration/timing/severity/associated sxs/prior Treatment) HPI   Cory [name] is a 34 y.o. male  who presents for evaluation of right shoulder pain for several days, worsened after lifting, at work. No known distinct trauma. No prior similar problem. No associated neck or back pain. He denies fever, chills, nausea, vomiting, weakness or dizziness. No other known modifying factors.   Past Medical History  Diagnosis Date  . Low back pain   . Chronic headache    History reviewed. No pertinent past surgical history. No family history on file. Social History  Substance Use Topics  . Smoking status: Current Every Day Smoker -- 0.50 packs/day    Types: Cigarettes  . Smokeless tobacco: None  . Alcohol Use: No    Review of Systems  All other systems reviewed and are negative.     Allergies  Ultram  Home Medications   Prior to Admission medications   Medication Sig Start Date End Date Taking? Authorizing Provider  cyclobenzaprine (FLEXERIL) 10 MG tablet Take 1 tablet (10 mg total) by mouth 3 (three) times daily as needed. 06/26/15   Tammy Triplett, PA-C  ibuprofen (ADVIL,MOTRIN) 800 MG tablet Take 1 tablet (800 mg total) by mouth 3 (three) times daily. 06/26/15   Tammy Triplett, PA-C  oxyCODONE-acetaminophen (PERCOCET/ROXICET) 5-325 MG per tablet Take 1 tablet by mouth every 4 (four) hours as needed. 06/26/15   Tammy Triplett, PA-C   BP 108/77 mmHg  Pulse 73  Temp(Src) 97.8 F (36.6 C) (Oral)  Resp 16  Ht 6' (1.829 m)  Wt 280 lb (127.007 kg)  BMI 37.97 kg/m2  SpO2 99% Physical Exam  Constitutional: He is oriented to person, place, and time. He appears well-developed and well-nourished.  HENT:  Head: Normocephalic and atraumatic.  Right Ear: External ear normal.  Left Ear: External ear normal.   Eyes: Conjunctivae and EOM are normal. Pupils are equal, round, and reactive to light.  Neck: Normal range of motion and phonation normal. Neck supple.  Cardiovascular: Normal rate.   Pulmonary/Chest: Effort normal. He exhibits no bony tenderness.  Musculoskeletal:  Decreased range of motion right shoulder secondary to pain. Mild anterior right shoulder swelling with moderate tenderness to palpation of the shoulder. No gross deformity.  Neurological: He is alert and oriented to person, place, and time. No cranial nerve deficit or sensory deficit. He exhibits normal muscle tone. Coordination normal.  Skin: Skin is warm, dry and intact.  Psychiatric: He has a normal mood and affect. His behavior is normal. Judgment and thought content normal.  Nursing note and vitals reviewed.   ED Course  Procedures (including critical care time)  Medications  oxyCODONE-acetaminophen (PERCOCET/ROXICET) 5-325 MG per tablet 2 tablet (not administered)    Patient Vitals for the past 24 hrs:  BP Temp Temp src Pulse Resp SpO2 Height Weight  10/27/15 1233 108/77 mmHg 97.8 F (36.6 C) Oral 73 16 99 % 6' (1.829 m) 280 lb (127.007 kg)    At discharge- Reevaluation with update and discussion. After initial assessment and treatment, an updated evaluation reveals no additional complaints. Findings discussed with the patient, all questions were answered. Cory [name] L    Labs Review Labs Reviewed - No data to display  Imaging Review No results found. I have personally reviewed and evaluated these images and lab results as  part of my medical decision-making.   EKG Interpretation None      MDM   Final diagnoses:  Right shoulder pain    Evaluation consistent with nonspecific shoulder pain with differential diagnosis including rotator cuff tear, rotator cuff tendinitis, and nonspecific strain.  Nursing Notes Reviewed/ Care Coordinated Applicable Imaging Reviewed Interpretation of Laboratory Data  incorporated into ED treatment  The patient appears reasonably screened and/or stabilized for discharge and I doubt any other medical condition or other [facility] requiring further screening, evaluation, or treatment in the ED at this time prior to discharge.  Plan: Home Medications- Percocet; Home Treatments- sling, light duty for one week; return here if the recommended treatment, does not improve the symptoms; Recommended follow up- orthopedic follow-up 1 week     Mancel BaleElliott Daivik Overley, MD 10/27/15 1423

## 2015-10-27 NOTE — ED Notes (Signed)
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 Pt states it hurts to move his arm in any direction. NAD noted.

## 2015-10-27 NOTE — Discharge Instructions (Signed)
Shoulder Pain The shoulder is the joint that connects your arms to your body. The bones that form the shoulder joint include the upper arm bone (humerus), the shoulder blade (scapula), and the collarbone (clavicle). The top of the humerus is shaped like a ball and fits into a rather flat socket on the scapula (glenoid cavity). A combination of muscles and strong, fibrous tissues that connect muscles to bones (tendons) support your shoulder joint and hold the ball in the socket. Small, fluid-filled sacs (bursae) are located in different areas of the joint. They act as cushions between the bones and the overlying soft tissues and help reduce friction between the gliding tendons and the bone as you move your arm. Your shoulder joint allows a wide range of motion in your arm. This range of motion allows you to do things like scratch your back or throw a ball. However, this range of motion also makes your shoulder more prone to pain from overuse and injury. Causes of shoulder pain can originate from both injury and overuse and usually can be grouped in the following four categories:  Redness, swelling, and pain (inflammation) of the tendon (tendinitis) or the bursae (bursitis).  Instability, such as a dislocation of the joint.  Inflammation of the joint (arthritis).  Broken bone (fracture). HOME CARE INSTRUCTIONS   Apply ice to the sore area.  Put ice in a plastic bag.  Place a towel between your skin and the bag.  Leave the ice on for 15-20 minutes, 3-4 times per day for the first 2 days, or as directed by your health care provider.  Stop using cold packs if they do not help with the pain.  If you have a shoulder sling or immobilizer, wear it as long as your caregiver instructs. Only remove it to shower or bathe. Move your arm as little as possible, but keep your hand moving to prevent swelling.  Squeeze a soft ball or foam pad as much as possible to help prevent swelling.  Only take  over-the-counter or prescription medicines for pain, discomfort, or fever as directed by your caregiver. SEEK MEDICAL CARE IF:   Your shoulder pain increases, or new pain develops in your arm, hand, or fingers.  Your hand or fingers become cold and numb.  Your pain is not relieved with medicines. SEEK IMMEDIATE MEDICAL CARE IF:   Your arm, hand, or fingers are numb or tingling.  Your arm, hand, or fingers are significantly swollen or turn white or blue. MAKE SURE YOU:   Understand these instructions.  Will watch your condition.  Will get help right away if you are not doing well or get worse.   This information is not intended to replace advice given to you by your health care provider. Make sure you discuss any questions you have with your health care provider.   Document Released: 09/09/2005 Document Revised: 12/21/2014 Document Reviewed: 03/25/2015 Elsevier Interactive Patient Education 2016 [place] therapy can help ease sore, stiff, injured, and tight muscles and joints. Heat relaxes your muscles, which may help ease your pain. Heat therapy should only be used on old, pre-existing, or long-lasting (chronic) injuries. Do not use heat therapy unless told by your doctor. HOW TO USE HEAT THERAPY There are several different kinds of heat therapy, including:  Moist heat pack.  Warm water bath.  Hot water bottle.  Electric heating pad.  Heated gel pack.  Heated wrap.  Electric heating pad. GENERAL HEAT THERAPY RECOMMENDATIONS  Do not sleep while using heat therapy. Only use heat therapy while you are awake.  Your skin may turn pink while using heat therapy. Do not use heat therapy if your skin turns red.  Do not use heat therapy if you have new pain.  High heat or long exposure to heat can cause burns. Be careful when using heat therapy to avoid burning your skin.  Do not use heat therapy on areas of your skin that are already irritated,  such as with a rash or sunburn. GET HELP IF:   You have blisters, redness, swelling (puffiness), or numbness.  You have new pain.  Your pain is worse. MAKE SURE YOU:  Understand these instructions.  Will watch your condition.  Will get help right away if you are not doing well or get worse.   This information is not intended to replace advice given to you by your health care provider. Make sure you discuss any questions you have with your health care provider.   Document Released: 02/22/2012 Document Revised: 12/21/2014 Document Reviewed: 01/23/2014 Elsevier Interactive Patient Education [employer] Elsevier Inc.

## 2016-01-13 ENCOUNTER — Encounter (HOSPITAL_COMMUNITY): Payer: Self-pay | Admitting: *Deleted

## 2016-01-13 ENCOUNTER — Emergency Department (HOSPITAL_COMMUNITY)
Admission: EM | Admit: 2016-01-13 | Discharge: 2016-01-14 | Disposition: A | Discharge status: Home / Self Care | Payer: Medicaid Other | Attending: Emergency Medicine | Admitting: Emergency Medicine

## 2016-01-13 DIAGNOSIS — F17210 Nicotine dependence, cigarettes, uncomplicated: Secondary | ICD-10-CM | POA: Insufficient documentation

## 2016-01-13 DIAGNOSIS — Z7952 Long term (current) use of systemic steroids: Secondary | ICD-10-CM | POA: Diagnosis not present

## 2016-01-13 DIAGNOSIS — R51 Headache: Secondary | ICD-10-CM | POA: Diagnosis not present

## 2016-01-13 DIAGNOSIS — K029 Dental caries, unspecified: Secondary | ICD-10-CM

## 2016-01-13 DIAGNOSIS — G8929 Other chronic pain: Secondary | ICD-10-CM | POA: Insufficient documentation

## 2016-01-13 DIAGNOSIS — K0889 Other specified disorders of teeth and supporting structures: Secondary | ICD-10-CM | POA: Diagnosis present

## 2016-01-13 MED ORDER — LIDOCAINE HCL (PF) 1 % IJ SOLN
INTRAMUSCULAR | Status: AC
  Administered 2016-01-13: 5 mL
  Filled 2016-01-13: qty 5

## 2016-01-13 MED ORDER — ACETAMINOPHEN-CODEINE #3 300-30 MG PO TABS: 1.0000 | ORAL_TABLET | Freq: Four times a day (QID) | ORAL | Status: DC | PRN

## 2016-01-13 MED ORDER — CEFTRIAXONE SODIUM 1 G IJ SOLR
1.0000 g | Freq: Once | INTRAMUSCULAR | Status: AC
  Administered 2016-01-13: 1 g via INTRAMUSCULAR
  Filled 2016-01-13: qty 10

## 2016-01-13 MED ORDER — ACETAMINOPHEN-CODEINE #3 300-30 MG PO TABS
2.0000 | ORAL_TABLET | Freq: Once | ORAL | Status: AC
  Administered 2016-01-13: 2 via ORAL
  Filled 2016-01-13: qty 2

## 2016-01-13 MED ORDER — ONDANSETRON HCL 4 MG PO TABS
4.0000 mg | ORAL_TABLET | Freq: Once | ORAL | Status: AC
  Administered 2016-01-13: 4 mg via ORAL
  Filled 2016-01-13: qty 1

## 2016-01-13 MED ORDER — IBUPROFEN 800 MG PO TABS
800.0000 mg | ORAL_TABLET | Freq: Once | ORAL | Status: AC
  Administered 2016-01-13: 800 mg via ORAL
  Filled 2016-01-13: qty 1

## 2016-01-13 MED ORDER — IBUPROFEN 600 MG PO TABS: 600.0000 mg | ORAL_TABLET | Freq: Four times a day (QID) | ORAL | Status: DC

## 2016-01-13 MED ORDER — AMOXICILLIN 500 MG PO CAPS: 500.0000 mg | ORAL_CAPSULE | Freq: Three times a day (TID) | ORAL | Status: DC

## 2016-01-13 NOTE — ED Notes (Signed)
Patient c/o left lower dental pain with decay. Patient states that after he ate yesterday the tooth pain increased.

## 2016-01-13 NOTE — ED Provider Notes (Signed)
CSN: [id number]     Arrival date & time 01/13/16  2154 History   First MD Initiated Contact with Patient 01/13/16 2232     Chief Complaint  Patient presents with  . Dental Pain     (Consider location/radiation/quality/duration/timing/severity/associated sxs/prior Treatment) Patient is a 35 y.o. male presenting with tooth pain. The history is provided by the patient.  Dental Pain Location:  Lower Quality:  Throbbing Severity:  Moderate Onset quality:  Gradual Duration:  1 day Timing:  Intermittent Progression:  Worsening Chronicity:  New Context: dental caries and poor dentition   Relieved by:  Nothing Worsened by:  Cold food/drink and hot food/drink Ineffective treatments:  NSAIDs Associated symptoms: headaches   Associated symptoms: no fever and no trismus   Risk factors: lack of dental care and smoking     Past Medical History  Diagnosis Date  . Low back pain   . Chronic headache    History reviewed. No pertinent past surgical history. History reviewed. No pertinent family history. Social History  Substance Use Topics  . Smoking status: Current Every Day Smoker -- 0.50 packs/day    Types: Cigarettes  . Smokeless tobacco: None  . Alcohol Use: No    Review of Systems  Constitutional: Negative for fever.  HENT: Positive for dental problem.   Neurological: Positive for headaches.  All other systems reviewed and are negative.     Allergies  Ultram  Home Medications   Prior to Admission medications   Medication Sig Start Date End Date Taking? Authorizing Provider  Aspirin-Acetaminophen-Caffeine (GOODY HEADACHE PO) Take 4 packets by mouth 2 (two) times daily as needed (pain).    Historical Provider, MD  oxyCODONE-acetaminophen (ROXICET) 5-325 MG tablet Take 1 tablet by mouth every 4 (four) hours as needed for severe pain. 10/27/15   Mancel Bale, MD  predniSONE (DELTASONE) 20 MG tablet Take 1 tablet (20 mg total) by mouth 2 (two) times daily. 10/27/15    Mancel Bale, MD   BP 117/74 mmHg  Pulse 72  Temp(Src) 99.9 F (37.7 C) (Tympanic)  Resp 18  Ht 6' (1.829 m)  Wt 124.739 kg  BMI 37.29 kg/m2  SpO2 100% Physical Exam  Constitutional: He is oriented to person, place, and time. He appears well-developed and well-nourished.  Non-toxic appearance.  HENT:  Head: Normocephalic.  Right Ear: Tympanic membrane and external ear normal.  Left Ear: Tympanic membrane and external ear normal.  Mouth/Throat: Dental caries present. No dental abscesses.  Pain of left lower molars. Left lower gum swollen. No abscess. No swelling under the tongue.  Eyes: EOM and lids are normal. Pupils are equal, round, and reactive to light.  Neck: Normal range of motion. Neck supple. Carotid bruit is not present.  Cardiovascular: Normal rate, regular rhythm, normal heart sounds, intact distal pulses and normal pulses.   Pulmonary/Chest: Breath sounds normal. No respiratory distress.  Abdominal: Soft. Bowel sounds are normal. There is no tenderness. There is no guarding.  Musculoskeletal: Normal range of motion.  Lymphadenopathy:       Head (right side): No submandibular adenopathy present.       Head (left side): No submandibular adenopathy present.    He has no cervical adenopathy.  Neurological: He is alert and oriented to person, place, and time. He has normal strength. No cranial nerve deficit or sensory deficit.  Skin: Skin is warm and dry.  Psychiatric: He has a normal mood and affect. His speech is normal.  Nursing note and vitals reviewed.  ED Course  Procedures (including critical care time) Labs Review Labs Reviewed - No data to display  Imaging Review No results found. I have personally reviewed and evaluated these images and lab results as part of my medical decision-making.   EKG Interpretation None      MDM  Vital signs reviewed. Exam favors dental caries and dental pain. Rx for amoxil. Ibuprofen, and tylenol codeine. Pt to see a  dentist as soon as possible.   Final diagnoses:  None    *I have reviewed nursing notes, vital signs, and all appropriate lab and imaging results for this patient.[address], PA-C 01/13/16 2302  Glynn Octave, MD 01/13/16 [phone]

## 2016-01-13 NOTE — ED Notes (Signed)
Pt c/o left upper tooth pain that started yesterday

## 2016-01-13 NOTE — Discharge Instructions (Signed)
Dental Caries °Dental caries is tooth decay. This decay can cause a hole in teeth (cavity) that can get bigger and deeper over time. °HOME CARE °· Brush and floss your teeth. Do this at least two times a day. °· Use a fluoride toothpaste. °· Use a mouth rinse if told by your dentist or doctor. °· Eat less sugary and starchy foods. Drink less sugary drinks. °· Avoid snacking often on sugary and starchy foods. Avoid sipping often on sugary drinks. °· Keep regular checkups and cleanings with your dentist. °· Use fluoride supplements if told by your dentist or doctor. °· Allow fluoride to be applied to teeth if told by your dentist or doctor. °  °This information is not intended to replace advice given to you by your health care provider. Make sure you discuss any questions you have with your health care provider. °  °Document Released: 09/08/2008 Document Revised: 12/21/2014 Document Reviewed: 12/02/2012 °Elsevier Interactive Patient Education ©2016 Elsevier Inc. ° °

## 2016-02-15 ENCOUNTER — Emergency Department (HOSPITAL_COMMUNITY)
Admission: EM | Admit: 2016-02-15 | Discharge: 2016-02-15 | Disposition: A | Discharge status: Home / Self Care | Payer: Medicaid Other | Attending: Emergency Medicine | Admitting: Emergency Medicine

## 2016-02-15 ENCOUNTER — Encounter (HOSPITAL_COMMUNITY): Payer: Self-pay | Admitting: *Deleted

## 2016-02-15 DIAGNOSIS — G8929 Other chronic pain: Secondary | ICD-10-CM | POA: Insufficient documentation

## 2016-02-15 DIAGNOSIS — K029 Dental caries, unspecified: Secondary | ICD-10-CM | POA: Diagnosis not present

## 2016-02-15 DIAGNOSIS — Z7952 Long term (current) use of systemic steroids: Secondary | ICD-10-CM | POA: Insufficient documentation

## 2016-02-15 DIAGNOSIS — Z792 Long term (current) use of antibiotics: Secondary | ICD-10-CM | POA: Diagnosis not present

## 2016-02-15 DIAGNOSIS — K047 Periapical abscess without sinus: Secondary | ICD-10-CM

## 2016-02-15 DIAGNOSIS — Z791 Long term (current) use of non-steroidal anti-inflammatories (NSAID): Secondary | ICD-10-CM | POA: Diagnosis not present

## 2016-02-15 DIAGNOSIS — F17210 Nicotine dependence, cigarettes, uncomplicated: Secondary | ICD-10-CM | POA: Insufficient documentation

## 2016-02-15 DIAGNOSIS — K0889 Other specified disorders of teeth and supporting structures: Secondary | ICD-10-CM | POA: Diagnosis present

## 2016-02-15 MED ORDER — ONDANSETRON HCL 4 MG PO TABS
4.0000 mg | ORAL_TABLET | Freq: Once | ORAL | Status: AC
  Administered 2016-02-15: 4 mg via ORAL
  Filled 2016-02-15: qty 1

## 2016-02-15 MED ORDER — AMOXICILLIN 500 MG PO CAPS: 500.0000 mg | ORAL_CAPSULE | Freq: Three times a day (TID) | ORAL | Status: DC

## 2016-02-15 MED ORDER — ACETAMINOPHEN-CODEINE #3 300-30 MG PO TABS: 1.0000 | ORAL_TABLET | Freq: Four times a day (QID) | ORAL | Status: DC | PRN

## 2016-02-15 MED ORDER — CLINDAMYCIN HCL 150 MG PO CAPS
300.0000 mg | ORAL_CAPSULE | Freq: Once | ORAL | Status: AC
  Administered 2016-02-15: 300 mg via ORAL
  Filled 2016-02-15: qty 2

## 2016-02-15 MED ORDER — IBUPROFEN 800 MG PO TABS
800.0000 mg | ORAL_TABLET | Freq: Once | ORAL | Status: AC
  Administered 2016-02-15: 800 mg via ORAL
  Filled 2016-02-15: qty 1

## 2016-02-15 NOTE — ED Notes (Addendum)
Pt c/o pain and swelling around a tooth on the lower left side of his mouth. Pt states he has been having dental pain x 3-4 days and swelling this morning. Pt has taken Goody powders, ibuprofen, and used oragel without relief.

## 2016-02-15 NOTE — ED Provider Notes (Signed)
CSN: [id number]     Arrival date & time 02/15/16  1858 History   First MD Initiated Contact with Patient 02/15/16 2024     Chief Complaint  Patient presents with  . abcess tooth      (Consider location/radiation/quality/duration/timing/severity/associated sxs/prior Treatment) Patient is a 35 y.o. male presenting with tooth pain. The history is provided by the patient.  Dental Pain Location:  Lower Quality:  Throbbing Severity:  Moderate Onset quality:  Gradual Duration:  4 days Timing:  Intermittent Progression:  Worsening Context: dental caries   Context: not trauma   Relieved by:  Nothing Worsened by:  Nothing tried Ineffective treatments:  NSAIDs and topical anesthetic gel Associated symptoms: facial swelling and gum swelling   Associated symptoms: no difficulty swallowing, no fever and no trismus   Risk factors: lack of dental care and smoking   Risk factors: no diabetes and no immunosuppression     Past Medical History  Diagnosis Date  . Low back pain   . Chronic headache    History reviewed. No pertinent past surgical history. History reviewed. No pertinent family history. Social History  Substance Use Topics  . Smoking status: Current Every Day Smoker -- 0.50 packs/day    Types: Cigarettes  . Smokeless tobacco: None  . Alcohol Use: No    Review of Systems  Constitutional: Negative for fever.  HENT: Positive for facial swelling.   All other systems reviewed and are negative.     Allergies  Ultram  Home Medications   Prior to Admission medications   Medication Sig Start Date End Date Taking? Authorizing Provider  acetaminophen-codeine (TYLENOL #3) 300-30 MG tablet Take 1-2 tablets by mouth every 6 (six) hours as needed for moderate pain. 01/13/16   Ivery QualeHobson Selwyn Reason, PA-C  amoxicillin (AMOXIL) 500 MG capsule Take 1 capsule (500 mg total) by mouth 3 (three) times daily. 01/13/16   Ivery QualeHobson Tineshia Becraft, PA-C  Aspirin-Acetaminophen-Caffeine (GOODY HEADACHE PO) Take 4  packets by mouth 2 (two) times daily as needed (pain).    Historical Provider, MD  ibuprofen (ADVIL,MOTRIN) 600 MG tablet Take 1 tablet (600 mg total) by mouth 4 (four) times daily. 01/13/16   Ivery QualeHobson Jasamine Pottinger, PA-C  oxyCODONE-acetaminophen (ROXICET) 5-325 MG tablet Take 1 tablet by mouth every 4 (four) hours as needed for severe pain. 10/27/15   Mancel BaleElliott Wentz, MD  predniSONE (DELTASONE) 20 MG tablet Take 1 tablet (20 mg total) by mouth 2 (two) times daily. 10/27/15   Mancel BaleElliott Wentz, MD   BP 116/64 mmHg  Pulse 63  Temp(Src) 97.9 F (36.6 C) (Oral)  Resp 18  Ht 6' (1.829 m)  Wt 122.471 kg  BMI 36.61 kg/m2  SpO2 98% Physical Exam  Constitutional: He is oriented to person, place, and time. He appears well-developed and well-nourished.  Non-toxic appearance.  HENT:  Head: Normocephalic.  Right Ear: Tympanic membrane and external ear normal.  Left Ear: Tympanic membrane and external ear normal.  There is swelling of the left lower gum, with dental caries present. There is no visible abscess noted. The airway is patent. There is no swelling under the tongue. Mild left facial swelling.  Eyes: EOM and lids are normal. Pupils are equal, round, and reactive to light.  Neck: Normal range of motion. Neck supple. Carotid bruit is not present.  Cardiovascular: Normal rate, regular rhythm, normal heart sounds, intact distal pulses and normal pulses.   Pulmonary/Chest: Breath sounds normal. No respiratory distress.  Abdominal: Soft. Bowel sounds are normal. There is no tenderness. There  is no guarding.  Musculoskeletal: Normal range of motion.  Lymphadenopathy:       Head (right side): No submandibular adenopathy present.       Head (left side): No submandibular adenopathy present.    He has no cervical adenopathy.  Neurological: He is alert and oriented to person, place, and time. He has normal strength. No cranial nerve deficit or sensory deficit.  Skin: Skin is warm and dry.  Psychiatric: He has a  normal mood and affect. His speech is normal.  Nursing note and vitals reviewed.   ED Course  Procedures (including critical care time) Labs Review Labs Reviewed - No data to display  Imaging Review No results found. I have personally reviewed and evaluated these images and lab results as part of my medical decision-making.   EKG Interpretation None      MDM  Patient has dental caries with swelling of the left lower gum. Some mild facial swelling. The airway is patent. There is no trismus. There is no evidence for Ludwig's angina, or other oral emergencies. The patient will be treated with Amoxil 3 times daily, and Tylenol codeine every 6 hours for pain not improved by ibuprofen. Patient strongly encouraged to see a dentist as soon as possible.    Final diagnoses:  Infected dental caries    **I have reviewed nursing notes, vital signs, and all appropriate lab and imaging results for this patient.[name], PA-C 02/18/16 1609  Samuel Jester, DO 02/19/16 [phone]

## 2016-02-15 NOTE — Discharge Instructions (Signed)
Please use Amoxil and 600 mg of ibuprofen 3 times daily with food. May use Tylenol codeine every 6 hours if needed for more severe pain. Please see Dr. Ledell PeoplesMuse for assistance with pain control until seen by your dentist. It is very important that she see a dentist concerning this cavity and swelling. Dental Caries Dental caries is tooth decay. This decay can cause a hole in teeth (cavity) that can get bigger and deeper over time. HOME CARE  Brush and floss your teeth. Do this at least two times a day.  Use a fluoride toothpaste.  Use a mouth rinse if told by your dentist or doctor.  Eat less sugary and starchy foods. Drink less sugary drinks.  Avoid snacking often on sugary and starchy foods. Avoid sipping often on sugary drinks.  Keep regular checkups and cleanings with your dentist.  Use fluoride supplements if told by your dentist or doctor.  Allow fluoride to be applied to teeth if told by your dentist or doctor.   This information is not intended to replace advice given to you by your health care provider. Make sure you discuss any questions you have with your health care provider.   Document Released: 09/08/2008 Document Revised: 12/21/2014 Document Reviewed: 12/02/2012 Elsevier Interactive Patient Education 2016 [employer] Resource Guide Dental The United Ways 211 is a great source of information about community services available.  Access by dialing 2-1-1 from anywhere in [place] , or by website -  PooledIncome.plwww.nc211.org.   Other Local Resources (Updated 12/2015)  Dental  Care   Services    Phone Number and Address  Cost  [facility] For children 410 - 37 years of age:   Cleaning  Tooth brushing/flossing instruction  Sealants, fillings, crowns  Extractions  Emergency treatment  [phone] [address] [place], [place] [id number] Charges based on family income.  Medicaid and some insurance plans accepted.      [facility]  Cleaning  Sealants, fillings, crowns  Extractions  Emergency treatment [phone] [address] [place], [place]  Pregnant women 37 years of age or older with a Medicaid card  [facility] [place]  Cleaning  Sealants, fillings, crowns  Extractions  Emergency treatment [phone] [place], [place] Pregnant women 37 years of age or older with a Medicaid card  [facility] For children 320 - 37 years of age:   Cleaning  Tooth brushing/flossing instruction  Sealants, fillings, crowns  Extractions  Emergency treatment Limited orthodontic services for patients with Medicaid [phone] [address] [place], [place] [id number] Medicaid and [facility] Health Choice cover for children up to age 37 and pregnant women.  Parents of children up to age 37 without Medicaid pay a reduced fee at time of service.  [facility] [place] For children 340 - 37 years of age:   Cleaning  Tooth brushing/flossing instruction  Sealants, fillings, crowns  Extractions  Emergency treatment Limited orthodontic services for patients with Medicaid [phone] [address] [place], [place].  Medicaid and [place] Health Choice cover for children up to age 37 and pregnant women.  Parents of children up to age 37 without Medicaid pay a reduced fee.  [facility]  Cleaning  Sealants, fillings, crowns  Extractions  Hours: Tuesdays and Thursdays, 4:15 - 8 pm [phone]  Michiel Sites [address] [place], [place] [address] Services free of charge to [facility] residents ages 18-64 who do not have health insurance, Medicare, [place], or [place] benefits and fall within federal poverty guidelines  [employer]    Provides dental care in addition to primary  medical care, nutritional counseling, and pharmacy:  Cleaning  Sealants, fillings, crowns  Extractions                  [phone] [facility], [address] [place], [place]  [phone] Phineas Real [facility], [address] [place], [place]  [phone] [facility] [place], [place]  [phone] [facility], [address] [place], [place]  [phone] [facility] [address] [place], [place] Accepts [place], [place], most insurance.  Also provides services available to all with fees adjusted based on ability to pay.    [facility]  Cleaning  Tooth brushing/flossing instruction  Sealants, fillings, crowns  Extractions  Emergency treatment Hours: Tuesdays, Thursdays, and Fridays from 8 am to 5 pm by appointment only. [phone] [address] [place], [place] [address] [facility] residents with Medicaid (depending on eligibility) and children with [facility] Health Choice - call for more information.  [facility]  Extractions only  Hours: 2nd and 4th Thursday of each month from 6:30 am - 9 am.   [phone] [address] [place], [place] [address] Ages 68 and older only.  Patients are seen on a first come, first served basis.  [employer]  [employer]  Extractions  Orthodontics  Endodontics  Implants/Crowns/Bridges  Complete and partial dentures [phone] [place], [place] Patients must complete an application for services.  There is often a waiting list.

## 2017-02-12 ENCOUNTER — Emergency Department (HOSPITAL_COMMUNITY)
Admission: EM | Admit: 2017-02-12 | Discharge: 2017-02-12 | Disposition: A | Discharge status: Home / Self Care | Payer: Medicaid Other | Attending: Emergency Medicine | Admitting: Emergency Medicine

## 2017-02-12 ENCOUNTER — Encounter (HOSPITAL_COMMUNITY): Payer: Self-pay | Admitting: Emergency Medicine

## 2017-02-12 DIAGNOSIS — F17210 Nicotine dependence, cigarettes, uncomplicated: Secondary | ICD-10-CM | POA: Insufficient documentation

## 2017-02-12 DIAGNOSIS — K047 Periapical abscess without sinus: Secondary | ICD-10-CM | POA: Diagnosis not present

## 2017-02-12 DIAGNOSIS — K0889 Other specified disorders of teeth and supporting structures: Secondary | ICD-10-CM | POA: Diagnosis present

## 2017-02-12 MED ORDER — IBUPROFEN 600 MG PO TABS: 600.0000 mg | ORAL_TABLET | Freq: Four times a day (QID) | ORAL | 0 refills | Status: DC | PRN

## 2017-02-12 MED ORDER — HYDROCODONE-ACETAMINOPHEN 5-325 MG PO TABS: 1.0000 | ORAL_TABLET | ORAL | 0 refills | Status: DC | PRN

## 2017-02-12 MED ORDER — AMOXICILLIN 250 MG PO CAPS
500.0000 mg | ORAL_CAPSULE | Freq: Once | ORAL | Status: AC
  Administered 2017-02-12: 500 mg via ORAL
  Filled 2017-02-12: qty 2

## 2017-02-12 MED ORDER — AMOXICILLIN 500 MG PO CAPS: 500.0000 mg | ORAL_CAPSULE | Freq: Three times a day (TID) | ORAL | 0 refills | Status: AC

## 2017-02-12 MED ORDER — HYDROCODONE-ACETAMINOPHEN 5-325 MG PO TABS
2.0000 | ORAL_TABLET | Freq: Once | ORAL | Status: AC
  Administered 2017-02-12: 2 via ORAL
  Filled 2017-02-12: qty 2

## 2017-02-12 NOTE — ED Triage Notes (Signed)
PT c/o left lower dental pain x1 day.

## 2017-02-12 NOTE — ED Provider Notes (Signed)
[facility]-EMERGENCY DEPT Provider Note   CSN: [id number] Arrival date & time: 02/12/17  1850     History   Chief Complaint Chief Complaint  Patient presents with  . Dental Pain    HPI Cory [name] is a 36 y.o. male.  The history is provided by the patient.  Dental Pain   This is a new problem. The current episode started 12 to 24 hours ago. The problem occurs constantly. The problem has been gradually worsening. The pain is at a severity of 10/10. The pain is severe. He has tried acetaminophen for the symptoms. The treatment provided no relief.   Pt reports chronic fracture of his left lower 1st molar and left lower 2nd molar tooth with decay but no pain until last night, worsened today.  He denies fevers, chills or drainage from the site.   Past Medical History:  Diagnosis Date  . Chronic headache   . Low back pain     There are no active problems to display for this patient.   History reviewed. No pertinent surgical history.     Home Medications    Prior to Admission medications   Medication Sig Start Date End Date Taking? Authorizing Provider  acetaminophen-codeine (TYLENOL #3) 300-30 MG tablet Take 1-2 tablets by mouth every 6 (six) hours as needed for moderate pain. 02/15/16   [name], PA-C  amoxicillin (AMOXIL) 500 MG capsule Take 1 capsule (500 mg total) by mouth 3 (three) times daily. 02/12/17 02/22/17  Burgess Amor, PA-C  Aspirin-Acetaminophen-Caffeine (GOODY HEADACHE PO) Take 4 packets by mouth 2 (two) times daily as needed (pain).    Historical Provider, MD  HYDROcodone-acetaminophen (NORCO/VICODIN) 5-325 MG tablet Take 1 tablet by mouth every 4 (four) hours as needed. 02/12/17   Burgess Amor, PA-C  ibuprofen (ADVIL,MOTRIN) 600 MG tablet Take 1 tablet (600 mg total) by mouth every 6 (six) hours as needed. 02/12/17   Burgess Amor, PA-C  oxyCODONE-acetaminophen (ROXICET) 5-325 MG tablet Take 1 tablet by mouth every 4 (four) hours as needed for severe pain. 10/27/15    Mancel Bale, MD  predniSONE (DELTASONE) 20 MG tablet Take 1 tablet (20 mg total) by mouth 2 (two) times daily. 10/27/15   Mancel Bale, MD    Family History History reviewed. No pertinent family history.  Social History Social History  Substance Use Topics  . Smoking status: Current Every Day Smoker    Packs/day: 0.50    Types: Cigarettes  . Smokeless tobacco: Never Used  . Alcohol use No     Allergies   Ultram [tramadol]   Review of Systems Review of Systems  Constitutional: Negative for fever.  HENT: Positive for dental problem. Negative for facial swelling and sore throat.   Respiratory: Negative for shortness of breath.   Musculoskeletal: Negative for neck pain and neck stiffness.     Physical Exam Updated Vital Signs BP 137/84 (BP Location: Left Arm)   Pulse 100   Temp 99 F (37.2 C) (Oral)   Resp 18   Ht 6' (1.829 m)   Wt 126.6 kg   SpO2 100%   BMI 37.84 kg/m   Physical Exam  Constitutional: He is oriented to person, place, and time. He appears well-developed and well-nourished. No distress.  HENT:  Head: Normocephalic and atraumatic.  Right Ear: Tympanic membrane and external ear normal.  Left Ear: Tympanic membrane and external ear normal.  Mouth/Throat: Oropharynx is clear and moist and mucous membranes are normal. No oral lesions. No trismus in  the jaw. Dental abscesses and dental caries present. No tonsillar abscesses.    Sublingual space is soft.  Eyes: Conjunctivae are normal.  Neck: Normal range of motion. Neck supple.  Cardiovascular: Normal rate and normal heart sounds.   Pulmonary/Chest: Effort normal.  Musculoskeletal: Normal range of motion.  Lymphadenopathy:    He has no cervical adenopathy.  Neurological: He is alert and oriented to person, place, and time.  Skin: Skin is warm and dry. No erythema.  Psychiatric: He has a normal mood and affect.     ED Treatments / Results  Labs (all labs ordered are listed, but only abnormal  results are displayed) Labs Reviewed - No data to display  EKG  EKG Interpretation None       Radiology No results found.  Procedures Procedures (including critical care time)  Medications Ordered in ED Medications  amoxicillin (AMOXIL) capsule 500 mg (not administered)  HYDROcodone-acetaminophen (NORCO/VICODIN) 5-325 MG per tablet 2 tablet (not administered)     Initial Impression / Assessment and Plan / ED Course  I have reviewed the triage vital signs and the nursing notes.  Pertinent labs & imaging results that were available during my care of the patient were reviewed by me and considered in my medical decision making (see chart for details).     Dental decay and new infected/early abscess but without fluctuance or drainable infection.  No sublingual involvement, no evidence of Ludwigs angina. Dental referrals given.  [place] controlled substance database reviewed. No entries x 6 months.  Final Clinical Impressions(s) / ED Diagnoses   Final diagnoses:  Dental abscess    New Prescriptions New Prescriptions   AMOXICILLIN (AMOXIL) 500 MG CAPSULE    Take 1 capsule (500 mg total) by mouth 3 (three) times daily.   HYDROCODONE-ACETAMINOPHEN (NORCO/VICODIN) 5-325 MG TABLET    Take 1 tablet by mouth every 4 (four) hours as needed.   IBUPROFEN (ADVIL,MOTRIN) 600 MG TABLET    Take 1 tablet (600 mg total) by mouth every 6 (six) hours as needed.     Burgess AmorJulie Carrell Rahmani, PA-C 02/12/17 1937    Margarita Grizzleanielle Ray, MD 02/12/17 2155

## 2017-02-12 NOTE — Discharge Instructions (Signed)
Complete your entire course of antibiotics as prescribed.  You  may use the hydrocodone for pain relief but do not drive within 4 hours of taking as this will make you drowsy.  Your pain should be controlled using ibuprofen after the first 24 hours, once the antibiotic has started to work.  Avoid applying heat or ice to this abscess area which can worsen your symptoms.  You may use warm salt water swish and spit treatment or half peroxide and water swish and spit after meals to keep this area clean.  You need a dentist for definitive treatment of these teeth, or these infections will just continue to return.   Call the dentist listed above for further management of your symptoms.

## 2020-06-13 DIAGNOSIS — Z419 Encounter for procedure for purposes other than remedying health state, unspecified: Secondary | ICD-10-CM | POA: Diagnosis not present

## 2020-07-14 DIAGNOSIS — Z419 Encounter for procedure for purposes other than remedying health state, unspecified: Secondary | ICD-10-CM | POA: Diagnosis not present

## 2020-08-14 DIAGNOSIS — Z419 Encounter for procedure for purposes other than remedying health state, unspecified: Secondary | ICD-10-CM | POA: Diagnosis not present

## 2020-08-31 ENCOUNTER — Emergency Department (HOSPITAL_COMMUNITY)
Admission: EM | Admit: 2020-08-31 | Discharge: 2020-08-31 | Disposition: A | Discharge status: Home / Self Care | Payer: Medicaid Other | Attending: Emergency Medicine | Admitting: Emergency Medicine

## 2020-08-31 ENCOUNTER — Other Ambulatory Visit: Payer: Self-pay

## 2020-08-31 ENCOUNTER — Emergency Department (HOSPITAL_COMMUNITY): Payer: Medicaid Other

## 2020-08-31 ENCOUNTER — Encounter (HOSPITAL_COMMUNITY): Payer: Self-pay | Admitting: Emergency Medicine

## 2020-08-31 DIAGNOSIS — R55 Syncope and collapse: Secondary | ICD-10-CM | POA: Diagnosis not present

## 2020-08-31 DIAGNOSIS — Z7982 Long term (current) use of aspirin: Secondary | ICD-10-CM | POA: Insufficient documentation

## 2020-08-31 DIAGNOSIS — U071 COVID-19: Secondary | ICD-10-CM | POA: Insufficient documentation

## 2020-08-31 DIAGNOSIS — F17210 Nicotine dependence, cigarettes, uncomplicated: Secondary | ICD-10-CM | POA: Insufficient documentation

## 2020-08-31 DIAGNOSIS — R519 Headache, unspecified: Secondary | ICD-10-CM | POA: Diagnosis present

## 2020-08-31 DIAGNOSIS — R200 Anesthesia of skin: Secondary | ICD-10-CM | POA: Diagnosis not present

## 2020-08-31 LAB — BASIC METABOLIC PANEL
Anion gap: 10 (ref 5–15)
BUN: 10 mg/dL (ref 6–20)
CO2: 23 mmol/L (ref 22–32)
Calcium: 8.6 mg/dL — ABNORMAL LOW (ref 8.9–10.3)
Chloride: 101 mmol/L (ref 98–111)
Creatinine, Ser: 0.8 mg/dL (ref 0.61–1.24)
GFR calc Af Amer: >60 mL/min (ref >60)
GFR calc non Af Amer: >60 mL/min (ref >60)
Glucose, Bld: 111 mg/dL — ABNORMAL HIGH (ref 70–99)
Potassium: 3.4 mmol/L — ABNORMAL LOW (ref 3.5–5.1)
Sodium: 134 mmol/L — ABNORMAL LOW (ref 135–145)

## 2020-08-31 LAB — URINALYSIS, ROUTINE W REFLEX MICROSCOPIC
Bacteria, UA: NONE SEEN
Bilirubin Urine: NEGATIVE
Glucose, UA: NEGATIVE mg/dL
Ketones, ur: NEGATIVE mg/dL
Leukocytes,Ua: NEGATIVE
Nitrite: NEGATIVE
Protein, ur: NEGATIVE mg/dL
Specific Gravity, Urine: 1.03 (ref 1.005–1.030)
pH: 6 (ref 5.0–8.0)

## 2020-08-31 LAB — CK: Total CK: 97 U/L (ref 49–397)

## 2020-08-31 LAB — CBG MONITORING, ED: Glucose-Capillary: 121 mg/dL — ABNORMAL HIGH (ref 70–99)

## 2020-08-31 LAB — CBC
HCT: 38.5 % — ABNORMAL LOW (ref 39.0–52.0)
Hemoglobin: 13.1 g/dL (ref 13.0–17.0)
MCH: 30.5 pg (ref 26.0–34.0)
MCHC: 34 g/dL (ref 30.0–36.0)
MCV: 89.5 fL (ref 80.0–100.0)
Platelets: 224 10*3/uL (ref 150–400)
RBC: 4.3 MIL/uL (ref 4.22–5.81)
RDW: 12.8 % (ref 11.5–15.5)
WBC: 10.3 10*3/uL (ref 4.0–10.5)
nRBC: 0 % (ref 0.0–0.2)

## 2020-08-31 LAB — TROPONIN I (HIGH SENSITIVITY)
Troponin I (High Sensitivity): 2 ng/L (ref <18)
Troponin I (High Sensitivity): 3 ng/L (ref <18)

## 2020-08-31 LAB — SARS CORONAVIRUS 2 BY RT PCR (HOSPITAL ORDER, PERFORMED IN ~~LOC~~ HOSPITAL LAB): SARS Coronavirus 2: POSITIVE — AB

## 2020-08-31 MED ORDER — SODIUM CHLORIDE 0.9 % IV SOLN: INTRAVENOUS | Status: DC

## 2020-08-31 MED ORDER — METOCLOPRAMIDE HCL 5 MG/ML IJ SOLN
10.0000 mg | Freq: Once | INTRAMUSCULAR | Status: AC
  Administered 2020-08-31: 10 mg via INTRAVENOUS
  Filled 2020-08-31: qty 2

## 2020-08-31 MED ORDER — SODIUM CHLORIDE 0.9 % IV BOLUS
1000.0000 mL | Freq: Once | INTRAVENOUS | Status: AC
  Administered 2020-08-31: 1000 mL via INTRAVENOUS

## 2020-08-31 MED ORDER — DIPHENHYDRAMINE HCL 50 MG/ML IJ SOLN
25.0000 mg | Freq: Once | INTRAMUSCULAR | Status: AC
  Administered 2020-08-31: 25 mg via INTRAVENOUS
  Filled 2020-08-31: qty 1

## 2020-08-31 MED ORDER — DEXAMETHASONE SODIUM PHOSPHATE 10 MG/ML IJ SOLN
10.0000 mg | Freq: Once | INTRAMUSCULAR | Status: AC
  Administered 2020-08-31: 10 mg via INTRAVENOUS
  Filled 2020-08-31: qty 1

## 2020-08-31 NOTE — ED Notes (Signed)
XR in room 

## 2020-08-31 NOTE — ED Triage Notes (Signed)
Patient reports near syncopal episode today while working outside [employer]." Patient now c/o severe headache. Per patient mid-sternal chest pain and numbness in left arm during near syncopal episode but denies any now. Denies any nausea or vomiting.

## 2020-08-31 NOTE — ED Provider Notes (Addendum)
[facility] EMERGENCY DEPARTMENT Provider Note   CSN: [id number] Arrival date & time: 08/31/20  1802     History Chief Complaint  Patient presents with  . Near Syncope    Cory [name] is a 39 y.o. male.  Patient works as a Animal nutritionist.  He was working outside about 1230 noon time he started to feel bad.  He developed a headache he had some midsternal chest pain which is now resolved and some numbness in his left arm.  Thought like he was got a passout.  Denies any of that now.  Denies any nausea or vomiting.  But he just feels very fatigued and weak all over.  Patient has not had any of the Covid vaccines.  Past medical history significant for low back pain and history of chronic headaches but patient denied having a headache this severe before.  He is a smoker.  No neck stiffness with it.  States that the headache is frontal.        Past Medical History:  Diagnosis Date  . Chronic headache   . Low back pain     There are no problems to display for this patient.   History reviewed. No pertinent surgical history.     History reviewed. No pertinent family history.  Social History   Tobacco Use  . Smoking status: Current Every Day Smoker    Packs/day: 0.50    Types: Cigarettes  . Smokeless tobacco: Never Used  Vaping Use  . Vaping Use: Never used  Substance Use Topics  . Alcohol use: No  . Drug use: No    Home Medications Prior to Admission medications   Medication Sig Start Date End Date Taking? Authorizing Provider  acetaminophen-codeine (TYLENOL #3) 300-30 MG tablet Take 1-2 tablets by mouth every 6 (six) hours as needed for moderate pain. 02/15/16   [name], PA-C  Aspirin-Acetaminophen-Caffeine (GOODY HEADACHE PO) Take 4 packets by mouth 2 (two) times daily as needed (pain).    [provider]  HYDROcodone-acetaminophen (NORCO/VICODIN) 5-325 MG tablet Take 1 tablet by mouth every 4 (four) hours as needed. 02/12/17   Burgess Amor, PA-C  ibuprofen  (ADVIL,MOTRIN) 600 MG tablet Take 1 tablet (600 mg total) by mouth every 6 (six) hours as needed. 02/12/17   Burgess Amor, PA-C  oxyCODONE-acetaminophen (ROXICET) 5-325 MG tablet Take 1 tablet by mouth every 4 (four) hours as needed for severe pain. 10/27/15   Mancel Bale, MD  predniSONE (DELTASONE) 20 MG tablet Take 1 tablet (20 mg total) by mouth 2 (two) times daily. 10/27/15   Mancel Bale, MD    Allergies    Ultram [tramadol]  Review of Systems   Review of Systems  Constitutional: Positive for fatigue. Negative for chills and fever.  HENT: Negative for congestion, rhinorrhea and sore throat.   Eyes: Negative for visual disturbance.  Respiratory: Negative for cough and shortness of breath.   Cardiovascular: Positive for chest pain. Negative for leg swelling.  Gastrointestinal: Negative for abdominal pain, diarrhea, nausea and vomiting.  Genitourinary: Negative for dysuria.  Musculoskeletal: Negative for back pain, myalgias, neck pain and neck stiffness.  Skin: Negative for rash.  Neurological: Positive for numbness and headaches. Negative for dizziness, syncope, speech difficulty, weakness and light-headedness.  Hematological: Does not bruise/bleed easily.  Psychiatric/Behavioral: Negative for confusion.    Physical Exam Updated Vital Signs BP 110/78   Pulse 98   Temp 99.3 F (37.4 C) (Oral)   Resp 20   Ht 1.829 m (  6')   Wt 117.9 kg   SpO2 100%   BMI 35.26 kg/m   Physical Exam Vitals and nursing note reviewed.  Constitutional:      Appearance: Normal appearance. He is well-developed.  HENT:     Head: Normocephalic and atraumatic.  Eyes:     Extraocular Movements: Extraocular movements intact.     Conjunctiva/sclera: Conjunctivae normal.     Pupils: Pupils are equal, round, and reactive to light.  Cardiovascular:     Rate and Rhythm: Normal rate and regular rhythm.     Heart sounds: No murmur heard.   Pulmonary:     Effort: Pulmonary effort is normal. No  respiratory distress.     Breath sounds: Normal breath sounds.  Abdominal:     Palpations: Abdomen is soft.     Tenderness: There is no abdominal tenderness.  Musculoskeletal:        General: No swelling. Normal range of motion.     Cervical back: Normal range of motion and neck supple. No rigidity.  Skin:    General: Skin is warm and dry.  Neurological:     General: No focal deficit present.     Mental Status: He is alert and oriented to person, place, and time.     Cranial Nerves: No cranial nerve deficit.     Sensory: No sensory deficit.     Motor: No weakness.     ED Results / Procedures / Treatments   Labs (all labs ordered are listed, but only abnormal results are displayed) Labs Reviewed  SARS CORONAVIRUS 2 BY RT PCR (HOSPITAL ORDER, PERFORMED IN [facility]) - Abnormal; Notable for the following components:      Result Value   SARS Coronavirus 2 POSITIVE (*)    All other components within normal limits  BASIC METABOLIC PANEL - Abnormal; Notable for the following components:   Sodium 134 (*)    Potassium 3.4 (*)    Glucose, Bld 111 (*)    Calcium 8.6 (*)    All other components within normal limits  CBC - Abnormal; Notable for the following components:   HCT 38.5 (*)    All other components within normal limits  URINALYSIS, ROUTINE W REFLEX MICROSCOPIC - Abnormal; Notable for the following components:   Color, Urine AMBER (*)    APPearance HAZY (*)    Hgb urine dipstick SMALL (*)    All other components within normal limits  CBG MONITORING, ED - Abnormal; Notable for the following components:   Glucose-Capillary 121 (*)    All other components within normal limits  CK  CBG MONITORING, ED  TROPONIN I (HIGH SENSITIVITY)  TROPONIN I (HIGH SENSITIVITY)    EKG EKG Interpretation  Date/Time:  Saturday August 31 2020 18:21:04 EDT Ventricular Rate:  90 PR Interval:  156 QRS Duration: 90 QT Interval:  364 QTC Calculation: 445 R Axis:   81 Text  Interpretation: Normal sinus rhythm Normal ECG Confirmed by Vanetta Mulders [id number]) on 08/31/2020 7:32:46 PM   Radiology CT HEAD WO CONTRAST  Result Date: 08/31/2020 CLINICAL DATA:  Near syncope. EXAM: CT HEAD WITHOUT CONTRAST TECHNIQUE: Contiguous axial images were obtained from the base of the skull through the vertex without intravenous contrast. COMPARISON:  March 31, 2015 FINDINGS: Brain: No evidence of acute infarction, hemorrhage, hydrocephalus, extra-axial collection or mass lesion/mass effect. Vascular: No hyperdense vessel or unexpected calcification. Skull: Normal. Negative for fracture or focal lesion. Sinuses/Orbits: No acute finding. Other: None. IMPRESSION: No acute intracranial  pathology. Electronically Signed   By: Aram Candela M.D.   On: 08/31/2020 18:57   DG Chest Port 1 View  Result Date: 08/31/2020 CLINICAL DATA:  Near syncope. EXAM: PORTABLE CHEST 1 VIEW COMPARISON:  March 31, 2015 FINDINGS: There is no evidence of acute infiltrate, pleural effusion or pneumothorax. The heart size and mediastinal contours are within normal limits. The visualized skeletal structures are unremarkable. IMPRESSION: No active disease. Electronically Signed   By: Aram Candela M.D.   On: 08/31/2020 20:05    Procedures Procedures (including critical care time)  Medications Ordered in ED Medications  0.9 %  sodium chloride infusion (has no administration in time range)  0.9 %  sodium chloride infusion (has no administration in time range)  sodium chloride 0.9 % bolus 1,000 mL (1,000 mLs Intravenous New Bag/Given (Non-Interop) 08/31/20 2010)  dexamethasone (DECADRON) injection 10 mg (10 mg Intravenous Given 08/31/20 2011)  metoCLOPramide (REGLAN) injection 10 mg (10 mg Intravenous Given 08/31/20 2011)  diphenhydrAMINE (BENADRYL) injection 25 mg (25 mg Intravenous Given 08/31/20 2011)    ED Course  I have reviewed the triage vital signs and the nursing notes.  Pertinent labs & imaging  results that were available during my care of the patient were reviewed by me and considered in my medical decision making (see chart for details).    MDM Rules/Calculators/A&P                          Patient without a history of migraines.  But based on the acute onset of the headache I decided to use migraine cocktail to see if we get improved.  His head CT already was known to be negative.  Patient's temperature here 99.3 no true fever.  Oxygen saturation is 97%.  Patient did not had any of the Covid vaccines.  Covid testing positive.  Labs without any real significant abnormalities.  His troponins were negative.  No chest pain now.  EKG without any acute findings.  Oxygen saturations are very good.  Chest x-ray without any acute findings.  Also checked a CK since he works outside.  That was not elevated.  Patient symptoms are all related to the COVID-19 infection.  No evidence of any meningitis.  Patient stable for discharge home with precautions work note provided.  Final Clinical Impression(s) / ED Diagnoses Final diagnoses:  COVID-19 virus infection    Rx / DC Orders ED Discharge Orders    None       Vanetta Mulders, MD 08/31/20 2216   Addendum:  Intended to make referral to monoclonal antibody team.  Forgot to put the request in last evening.  We will go ahead and put it in today.  Patient's BMI qualifies him for consideration for monoclonal antibody.       Vanetta Mulders, MD 09/01/20 [phone]

## 2020-08-31 NOTE — ED Triage Notes (Signed)
Dr Deretha Emory aware of patient's status, orders verified with verbal orders also given.

## 2020-08-31 NOTE — ED Notes (Signed)
In bed with eyes closed.

## 2020-08-31 NOTE — Discharge Instructions (Addendum)
Your Covid test was positive.  Is probably the reason why you are feeling bad the rest of your labs checked out okay.  Work note provided.  You need to be out of work for the next 2 weeks.  Information on how to manage Covid at home provided.  And also make a follow-up with your regular doctor.  Turn for shortness of breath or trouble breathing.  Tylenol for the headache.  But with the delta variant of Covid headaches are very common.  Would also recommend taking over-the-counter vitamin D and zinc supplements.

## 2020-08-31 NOTE — ED Notes (Signed)
Ambulatory to restroom without shortness of breath

## 2020-09-01 ENCOUNTER — Telehealth (HOSPITAL_COMMUNITY): Payer: Self-pay | Admitting: Adult Health

## 2020-09-01 NOTE — Telephone Encounter (Signed)
Called and talked to patient about COVID 19.  Due to his BMI he is in an at risk group for complications or hospitalization from the virus and meets criteria for treatment with monoclonal antibodies.  He notes that he had a headache yesterday and near syncopal episode that prompted ER visit and testing.  He is asymptomatic today.  I reviewed the benefit of treatment with him in detail, should his symptoms return, as with covid they can sometimes improve and relapse.  I gave him our call back number at [phone] to call us if he wants treatment.  He understands treatment must be given within first 10 days of symptom onset.  His symptom onset is 08/31/2020.  Lillard Anes, NP

## 2020-09-02 ENCOUNTER — Encounter: Payer: Self-pay | Admitting: Physician Assistant

## 2020-09-02 ENCOUNTER — Telehealth: Payer: Self-pay | Admitting: Unknown Physician Specialty

## 2020-09-02 NOTE — Telephone Encounter (Signed)
Called to discuss with Shea Stakes about Covid symptoms and the use of bamlanivimab, a monoclonal antibody infusion for those with mild to moderate Covid symptoms and at a high risk of hospitalization.      Pt does not qualify for infusion therapy as he has asymptomatic infection following a ER visit and getting fluids. Isolation precautions discussed. Advised to contact back for consideration should he develop symptoms. Patient verbalized understanding.      Patient Active Problem List   Diagnosis Date Noted  . Morbid obesity (HCC)

## 2020-09-03 ENCOUNTER — Telehealth: Payer: Self-pay

## 2020-09-03 NOTE — Telephone Encounter (Signed)
Transition Care Management Follow-up Telephone Call  Date of discharge and from where: 08/31/2020  How have you been since you were released from the hospital? Patient states that he is feeling well and not having any issues.   Any questions or concerns? Patient had no questions.   Items Reviewed:  Did the pt receive and understand the discharge instructions provided? Yes  Medications obtained and verified? Yes   Any new allergies since your discharge? No   Dietary orders reviewed? Yes  Do you have support at home? Yes   Functional Questionnaire: (I = Independent and D = Dependent) ADLs: I  Bathing/Dressing- I  Meal Prep- I  Eating- I  Maintaining continence- I  Transferring/Ambulation- I  Managing Meds- I  Follow up appointments reviewed:   PCP Hospital f/u appt confirmed?  Patient stated that he does not have a PCP and is not interested in connecting with one at this time. Patient give location and number for the [facility] if he decides he needs to see someone intermittently until he does establish with a PCP.   Are transportation arrangements needed? No   If their condition worsens, is the pt aware to call PCP or go to the Emergency Dept.? Yes  Was the patient provided with contact information for the PCP's office or ED? N/A  Was to pt encouraged to call back with questions or concerns? Gave contact for [facility] in the interim.

## 2020-09-13 DIAGNOSIS — Z419 Encounter for procedure for purposes other than remedying health state, unspecified: Secondary | ICD-10-CM | POA: Diagnosis not present

## 2020-10-14 DIAGNOSIS — Z419 Encounter for procedure for purposes other than remedying health state, unspecified: Secondary | ICD-10-CM | POA: Diagnosis not present

## 2020-11-13 DIAGNOSIS — Z419 Encounter for procedure for purposes other than remedying health state, unspecified: Secondary | ICD-10-CM | POA: Diagnosis not present

## 2020-12-14 DIAGNOSIS — Z419 Encounter for procedure for purposes other than remedying health state, unspecified: Secondary | ICD-10-CM | POA: Diagnosis not present

## 2021-01-14 DIAGNOSIS — Z419 Encounter for procedure for purposes other than remedying health state, unspecified: Secondary | ICD-10-CM | POA: Diagnosis not present

## 2021-02-06 ENCOUNTER — Emergency Department (HOSPITAL_COMMUNITY): Payer: Medicaid Other

## 2021-02-06 ENCOUNTER — Emergency Department (HOSPITAL_COMMUNITY)
Admission: EM | Admit: 2021-02-06 | Discharge: 2021-02-07 | Payer: Medicaid Other | Attending: Emergency Medicine | Admitting: Emergency Medicine

## 2021-02-06 DIAGNOSIS — T1490XA Injury, unspecified, initial encounter: Secondary | ICD-10-CM

## 2021-02-06 DIAGNOSIS — S82209A Unspecified fracture of shaft of unspecified tibia, initial encounter for closed fracture: Secondary | ICD-10-CM | POA: Diagnosis present

## 2021-02-06 DIAGNOSIS — T148XXA Other injury of unspecified body region, initial encounter: Secondary | ICD-10-CM

## 2021-02-06 DIAGNOSIS — R58 Hemorrhage, not elsewhere classified: Secondary | ICD-10-CM | POA: Diagnosis not present

## 2021-02-06 DIAGNOSIS — S8992XA Unspecified injury of left lower leg, initial encounter: Secondary | ICD-10-CM | POA: Diagnosis not present

## 2021-02-06 DIAGNOSIS — S79922A Unspecified injury of left thigh, initial encounter: Secondary | ICD-10-CM | POA: Diagnosis not present

## 2021-02-06 DIAGNOSIS — S82102A Unspecified fracture of upper end of left tibia, initial encounter for closed fracture: Secondary | ICD-10-CM | POA: Insufficient documentation

## 2021-02-06 DIAGNOSIS — Y92410 Unspecified street and highway as the place of occurrence of the external cause: Secondary | ICD-10-CM | POA: Insufficient documentation

## 2021-02-06 DIAGNOSIS — S8262XA Displaced fracture of lateral malleolus of left fibula, initial encounter for closed fracture: Secondary | ICD-10-CM | POA: Diagnosis not present

## 2021-02-06 DIAGNOSIS — Z20822 Contact with and (suspected) exposure to covid-19: Secondary | ICD-10-CM | POA: Diagnosis not present

## 2021-02-06 DIAGNOSIS — M160 Bilateral primary osteoarthritis of hip: Secondary | ICD-10-CM | POA: Diagnosis not present

## 2021-02-06 DIAGNOSIS — S82852A Displaced trimalleolar fracture of left lower leg, initial encounter for closed fracture: Secondary | ICD-10-CM | POA: Insufficient documentation

## 2021-02-06 DIAGNOSIS — R0902 Hypoxemia: Secondary | ICD-10-CM | POA: Diagnosis not present

## 2021-02-06 DIAGNOSIS — S82452A Displaced comminuted fracture of shaft of left fibula, initial encounter for closed fracture: Secondary | ICD-10-CM | POA: Diagnosis not present

## 2021-02-06 DIAGNOSIS — S3993XA Unspecified injury of pelvis, initial encounter: Secondary | ICD-10-CM | POA: Diagnosis not present

## 2021-02-06 DIAGNOSIS — M79605 Pain in left leg: Secondary | ICD-10-CM | POA: Diagnosis not present

## 2021-02-06 DIAGNOSIS — Z743 Need for continuous supervision: Secondary | ICD-10-CM | POA: Diagnosis not present

## 2021-02-06 LAB — I-STAT CHEM 8, ED
BUN: 11 mg/dL (ref 6–20)
Calcium, Ion: 1.02 mmol/L — ABNORMAL LOW (ref 1.15–1.40)
Chloride: 105 mmol/L (ref 98–111)
Creatinine, Ser: 0.8 mg/dL (ref 0.61–1.24)
Glucose, Bld: 162 mg/dL — ABNORMAL HIGH (ref 70–99)
HCT: 40 % (ref 39.0–52.0)
Hemoglobin: 13.6 g/dL (ref 13.0–17.0)
Potassium: 3.1 mmol/L — ABNORMAL LOW (ref 3.5–5.1)
Sodium: 137 mmol/L (ref 135–145)
TCO2: 20 mmol/L — ABNORMAL LOW (ref 22–32)

## 2021-02-06 LAB — SAMPLE TO BLOOD BANK

## 2021-02-06 MED ORDER — TETANUS-DIPHTH-ACELL PERTUSSIS 5-2.5-18.5 LF-MCG/0.5 IM SUSY: 0.5000 mL | PREFILLED_SYRINGE | Freq: Once | INTRAMUSCULAR | Status: DC

## 2021-02-06 MED ORDER — CEFAZOLIN SODIUM-DEXTROSE 2-4 GM/100ML-% IV SOLN
2.0000 g | Freq: Once | INTRAVENOUS | Status: AC
  Administered 2021-02-06: 2 g via INTRAVENOUS
  Filled 2021-02-06: qty 100

## 2021-02-06 MED ORDER — HYDROMORPHONE HCL 1 MG/ML IJ SOLN
INTRAMUSCULAR | Status: AC
  Filled 2021-02-06: qty 1
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
## 2021-02-07 ENCOUNTER — Emergency Department (HOSPITAL_COMMUNITY): Payer: Medicaid Other

## 2021-02-07 ENCOUNTER — Encounter (HOSPITAL_COMMUNITY): Payer: Self-pay | Admitting: Student

## 2021-02-07 DIAGNOSIS — S82209A Unspecified fracture of shaft of unspecified tibia, initial encounter for closed fracture: Secondary | ICD-10-CM | POA: Diagnosis present

## 2021-02-07 DIAGNOSIS — S82462B Displaced segmental fracture of shaft of left fibula, initial encounter for open fracture type I or II: Secondary | ICD-10-CM | POA: Diagnosis not present

## 2021-02-07 DIAGNOSIS — S82872A Displaced pilon fracture of left tibia, initial encounter for closed fracture: Secondary | ICD-10-CM | POA: Diagnosis not present

## 2021-02-07 DIAGNOSIS — S93432A Sprain of tibiofibular ligament of left ankle, initial encounter: Secondary | ICD-10-CM | POA: Diagnosis not present

## 2021-02-07 DIAGNOSIS — S92412A Displaced fracture of proximal phalanx of left great toe, initial encounter for closed fracture: Secondary | ICD-10-CM | POA: Diagnosis not present

## 2021-02-07 DIAGNOSIS — Y92410 Unspecified street and highway as the place of occurrence of the external cause: Secondary | ICD-10-CM | POA: Diagnosis not present

## 2021-02-07 DIAGNOSIS — R590 Localized enlarged lymph nodes: Secondary | ICD-10-CM | POA: Diagnosis not present

## 2021-02-07 DIAGNOSIS — J811 Chronic pulmonary edema: Secondary | ICD-10-CM | POA: Diagnosis not present

## 2021-02-07 DIAGNOSIS — F17210 Nicotine dependence, cigarettes, uncomplicated: Secondary | ICD-10-CM | POA: Diagnosis not present

## 2021-02-07 DIAGNOSIS — S82392A Other fracture of lower end of left tibia, initial encounter for closed fracture: Secondary | ICD-10-CM | POA: Diagnosis not present

## 2021-02-07 DIAGNOSIS — Z20822 Contact with and (suspected) exposure to covid-19: Secondary | ICD-10-CM | POA: Diagnosis not present

## 2021-02-07 DIAGNOSIS — S99912A Unspecified injury of left ankle, initial encounter: Secondary | ICD-10-CM | POA: Diagnosis not present

## 2021-02-07 DIAGNOSIS — S82452A Displaced comminuted fracture of shaft of left fibula, initial encounter for closed fracture: Secondary | ICD-10-CM | POA: Diagnosis not present

## 2021-02-07 DIAGNOSIS — S8992XA Unspecified injury of left lower leg, initial encounter: Secondary | ICD-10-CM | POA: Diagnosis not present

## 2021-02-07 DIAGNOSIS — S81802A Unspecified open wound, left lower leg, initial encounter: Secondary | ICD-10-CM | POA: Diagnosis not present

## 2021-02-07 DIAGNOSIS — Y999 Unspecified external cause status: Secondary | ICD-10-CM | POA: Diagnosis not present

## 2021-02-07 DIAGNOSIS — T1490XA Injury, unspecified, initial encounter: Secondary | ICD-10-CM | POA: Diagnosis not present

## 2021-02-07 DIAGNOSIS — D62 Acute posthemorrhagic anemia: Secondary | ICD-10-CM | POA: Diagnosis not present

## 2021-02-07 DIAGNOSIS — E871 Hypo-osmolality and hyponatremia: Secondary | ICD-10-CM | POA: Diagnosis not present

## 2021-02-07 DIAGNOSIS — Z8616 Personal history of COVID-19: Secondary | ICD-10-CM | POA: Diagnosis not present

## 2021-02-07 DIAGNOSIS — S199XXA Unspecified injury of neck, initial encounter: Secondary | ICD-10-CM | POA: Diagnosis not present

## 2021-02-07 DIAGNOSIS — S82252A Displaced comminuted fracture of shaft of left tibia, initial encounter for closed fracture: Secondary | ICD-10-CM | POA: Diagnosis not present

## 2021-02-07 DIAGNOSIS — R6520 Severe sepsis without septic shock: Secondary | ICD-10-CM | POA: Diagnosis not present

## 2021-02-07 DIAGNOSIS — Z049 Encounter for examination and observation for unspecified reason: Secondary | ICD-10-CM | POA: Diagnosis not present

## 2021-02-07 DIAGNOSIS — S0990XA Unspecified injury of head, initial encounter: Secondary | ICD-10-CM | POA: Diagnosis not present

## 2021-02-07 DIAGNOSIS — S82852B Displaced trimalleolar fracture of left lower leg, initial encounter for open fracture type I or II: Secondary | ICD-10-CM | POA: Diagnosis not present

## 2021-02-07 DIAGNOSIS — S82852A Displaced trimalleolar fracture of left lower leg, initial encounter for closed fracture: Secondary | ICD-10-CM | POA: Diagnosis not present

## 2021-02-07 DIAGNOSIS — S82202B Unspecified fracture of shaft of left tibia, initial encounter for open fracture type I or II: Secondary | ICD-10-CM | POA: Diagnosis not present

## 2021-02-07 DIAGNOSIS — S82402B Unspecified fracture of shaft of left fibula, initial encounter for open fracture type I or II: Secondary | ICD-10-CM | POA: Diagnosis not present

## 2021-02-07 DIAGNOSIS — S82852C Displaced trimalleolar fracture of left lower leg, initial encounter for open fracture type IIIA, IIIB, or IIIC: Secondary | ICD-10-CM | POA: Diagnosis not present

## 2021-02-07 DIAGNOSIS — A419 Sepsis, unspecified organism: Secondary | ICD-10-CM | POA: Diagnosis not present

## 2021-02-07 DIAGNOSIS — S82832A Other fracture of upper and lower end of left fibula, initial encounter for closed fracture: Secondary | ICD-10-CM | POA: Diagnosis not present

## 2021-02-07 DIAGNOSIS — F1290 Cannabis use, unspecified, uncomplicated: Secondary | ICD-10-CM | POA: Diagnosis not present

## 2021-02-07 DIAGNOSIS — S71102A Unspecified open wound, left thigh, initial encounter: Secondary | ICD-10-CM | POA: Diagnosis not present

## 2021-02-07 DIAGNOSIS — S71112A Laceration without foreign body, left thigh, initial encounter: Secondary | ICD-10-CM | POA: Diagnosis not present

## 2021-02-07 DIAGNOSIS — S82892C Other fracture of left lower leg, initial encounter for open fracture type IIIA, IIIB, or IIIC: Secondary | ICD-10-CM | POA: Diagnosis not present

## 2021-02-07 DIAGNOSIS — S8262XA Displaced fracture of lateral malleolus of left fibula, initial encounter for closed fracture: Secondary | ICD-10-CM | POA: Diagnosis not present

## 2021-02-07 DIAGNOSIS — S82102A Unspecified fracture of upper end of left tibia, initial encounter for closed fracture: Secondary | ICD-10-CM | POA: Diagnosis not present

## 2021-02-07 DIAGNOSIS — Z041 Encounter for examination and observation following transport accident: Secondary | ICD-10-CM | POA: Diagnosis not present

## 2021-02-07 DIAGNOSIS — M7989 Other specified soft tissue disorders: Secondary | ICD-10-CM | POA: Diagnosis not present

## 2021-02-07 DIAGNOSIS — K219 Gastro-esophageal reflux disease without esophagitis: Secondary | ICD-10-CM | POA: Diagnosis not present

## 2021-02-07 LAB — COMPREHENSIVE METABOLIC PANEL
ALT: 29 U/L (ref 0–44)
AST: 27 U/L (ref 15–41)
Albumin: 3.7 g/dL (ref 3.5–5.0)
Alkaline Phosphatase: 68 U/L (ref 38–126)
Anion gap: 13 (ref 5–15)
BUN: 11 mg/dL (ref 6–20)
CO2: 20 mmol/L — ABNORMAL LOW (ref 22–32)
Calcium: 8.9 mg/dL (ref 8.9–10.3)
Chloride: 103 mmol/L (ref 98–111)
Creatinine, Ser: 0.91 mg/dL (ref 0.61–1.24)
GFR, Estimated: >60 mL/min (ref >60)
Glucose, Bld: 165 mg/dL — ABNORMAL HIGH (ref 70–99)
Potassium: 3 mmol/L — ABNORMAL LOW (ref 3.5–5.1)
Sodium: 136 mmol/L (ref 135–145)
Total Bilirubin: 0.5 mg/dL (ref 0.3–1.2)
Total Protein: 6.4 g/dL — ABNORMAL LOW (ref 6.5–8.1)

## 2021-02-07 LAB — CBC
HCT: 38.8 % — ABNORMAL LOW (ref 39.0–52.0)
Hemoglobin: 13.5 g/dL (ref 13.0–17.0)
MCH: 30.5 pg (ref 26.0–34.0)
MCHC: 34.8 g/dL (ref 30.0–36.0)
MCV: 87.8 fL (ref 80.0–100.0)
Platelets: 278 10*3/uL (ref 150–400)
RBC: 4.42 MIL/uL (ref 4.22–5.81)
RDW: 12.4 % (ref 11.5–15.5)
WBC: 14.6 10*3/uL — ABNORMAL HIGH (ref 4.0–10.5)
nRBC: 0 % (ref 0.0–0.2)

## 2021-02-07 LAB — PROTIME-INR
INR: 1 (ref 0.8–1.2)
Prothrombin Time: 13.2 seconds (ref 11.4–15.2)

## 2021-02-07 LAB — ETHANOL: Alcohol, Ethyl (B): <10 mg/dL (ref <10)

## 2021-02-07 LAB — RESP PANEL BY RT-PCR (FLU A&B, COVID) ARPGX2
Influenza A by PCR: NEGATIVE
Influenza B by PCR: NEGATIVE
SARS Coronavirus 2 by RT PCR: NEGATIVE

## 2021-02-07 LAB — LACTIC ACID, PLASMA: Lactic Acid, Venous: 3.5 mmol/L (ref 0.5–1.9)

## 2021-02-07 MED ORDER — MORPHINE SULFATE (PF) 4 MG/ML IV SOLN
8.0000 mg | Freq: Once | INTRAVENOUS | Status: AC
Start: 2021-02-07 — End: 2021-02-07
  Administered 2021-02-07: 8 mg via INTRAVENOUS
  Filled 2021-02-07: qty 2

## 2021-02-07 MED ORDER — HYDROMORPHONE HCL 1 MG/ML IJ SOLN
1.0000 mg | INTRAMUSCULAR | Status: DC | PRN
  Administered 2021-02-07 (×2): 1 mg via INTRAVENOUS
  Filled 2021-02-07 (×2): qty 1

## 2021-02-07 MED ORDER — HYDROMORPHONE HCL 1 MG/ML IJ SOLN
1.0000 mg | Freq: Once | INTRAMUSCULAR | Status: AC
  Administered 2021-02-07: 1 mg via INTRAVENOUS
  Filled 2021-02-07: qty 1

## 2021-02-07 MED ORDER — SODIUM CHLORIDE 0.9 % IV SOLN: Freq: Once | INTRAVENOUS | Status: AC

## 2021-02-07 MED ORDER — MORPHINE SULFATE (PF) 4 MG/ML IV SOLN
INTRAVENOUS | Status: AC
  Filled 2021-02-07: qty 2

## 2021-02-07 MED ORDER — MORPHINE SULFATE (PF) 4 MG/ML IV SOLN
8.0000 mg | Freq: Once | INTRAVENOUS | Status: AC
Start: 2021-02-07 — End: 2021-02-07
  Administered 2021-02-07: 8 mg via INTRAVENOUS

## 2021-02-07 MED ORDER — CEFAZOLIN SODIUM-DEXTROSE 2-4 GM/100ML-% IV SOLN
2.0000 g | Freq: Three times a day (TID) | INTRAVENOUS | Status: DC
  Administered 2021-02-07: 2 g via INTRAVENOUS
  Filled 2021-02-07 (×2): qty 100

## 2021-02-07 NOTE — ED Notes (Signed)
Pt complaining of increased pain in ankle and burning in toes, provider aware.  Pt updated on plan of care and reasoning for NPO status at this time

## 2021-02-07 NOTE — Progress Notes (Signed)
Orthopedic Tech Progress Note Patient Details:  Cory [name] 10-15-1981 [id number]  Ortho Devices Type of Ortho Device: Post (short leg) splint,Stirrup splint Ortho Device/Splint Location: Left Leg Ortho Device/Splint Interventions: Application       Duriel Deery E Alahna Dunne 02/07/2021, 12:26 AM
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
## 2021-02-07 NOTE — ED Notes (Signed)
Attempted to call report pt unable to take report at this time

## 2021-02-08 DIAGNOSIS — S82852A Displaced trimalleolar fracture of left lower leg, initial encounter for closed fracture: Secondary | ICD-10-CM | POA: Diagnosis not present

## 2021-02-08 DIAGNOSIS — S92122A Displaced fracture of body of left talus, initial encounter for closed fracture: Secondary | ICD-10-CM | POA: Diagnosis not present

## 2021-02-08 DIAGNOSIS — S92022A Displaced fracture of anterior process of left calcaneus, initial encounter for closed fracture: Secondary | ICD-10-CM | POA: Diagnosis not present

## 2021-02-10 DIAGNOSIS — S82892C Other fracture of left lower leg, initial encounter for open fracture type IIIA, IIIB, or IIIC: Secondary | ICD-10-CM | POA: Diagnosis not present

## 2021-02-11 DIAGNOSIS — R000 Tachycardia, unspecified: Secondary | ICD-10-CM | POA: Diagnosis not present

## 2021-02-11 DIAGNOSIS — T8189XA Other complications of procedures, not elsewhere classified, initial encounter: Secondary | ICD-10-CM | POA: Diagnosis not present

## 2021-02-11 DIAGNOSIS — K219 Gastro-esophageal reflux disease without esophagitis: Secondary | ICD-10-CM | POA: Diagnosis not present

## 2021-02-11 DIAGNOSIS — R0602 Shortness of breath: Secondary | ICD-10-CM | POA: Diagnosis not present

## 2021-02-11 DIAGNOSIS — G8918 Other acute postprocedural pain: Secondary | ICD-10-CM | POA: Diagnosis not present

## 2021-02-11 DIAGNOSIS — D62 Acute posthemorrhagic anemia: Secondary | ICD-10-CM | POA: Diagnosis not present

## 2021-02-11 DIAGNOSIS — Z419 Encounter for procedure for purposes other than remedying health state, unspecified: Secondary | ICD-10-CM | POA: Diagnosis not present

## 2021-02-11 DIAGNOSIS — A419 Sepsis, unspecified organism: Secondary | ICD-10-CM | POA: Diagnosis not present

## 2021-02-11 DIAGNOSIS — E871 Hypo-osmolality and hyponatremia: Secondary | ICD-10-CM | POA: Diagnosis not present

## 2021-02-12 DIAGNOSIS — D62 Acute posthemorrhagic anemia: Secondary | ICD-10-CM | POA: Diagnosis not present

## 2021-02-12 DIAGNOSIS — S82892C Other fracture of left lower leg, initial encounter for open fracture type IIIA, IIIB, or IIIC: Secondary | ICD-10-CM | POA: Diagnosis not present

## 2021-02-12 DIAGNOSIS — A419 Sepsis, unspecified organism: Secondary | ICD-10-CM | POA: Diagnosis not present

## 2021-02-12 DIAGNOSIS — G8918 Other acute postprocedural pain: Secondary | ICD-10-CM | POA: Diagnosis not present

## 2021-02-12 DIAGNOSIS — R000 Tachycardia, unspecified: Secondary | ICD-10-CM | POA: Diagnosis not present

## 2021-02-12 DIAGNOSIS — E871 Hypo-osmolality and hyponatremia: Secondary | ICD-10-CM | POA: Diagnosis not present

## 2021-02-12 DIAGNOSIS — S81802A Unspecified open wound, left lower leg, initial encounter: Secondary | ICD-10-CM | POA: Diagnosis not present

## 2021-02-13 DIAGNOSIS — A419 Sepsis, unspecified organism: Secondary | ICD-10-CM | POA: Diagnosis not present

## 2021-02-13 DIAGNOSIS — M79605 Pain in left leg: Secondary | ICD-10-CM | POA: Diagnosis not present

## 2021-02-13 DIAGNOSIS — E871 Hypo-osmolality and hyponatremia: Secondary | ICD-10-CM | POA: Diagnosis not present

## 2021-02-13 DIAGNOSIS — D62 Acute posthemorrhagic anemia: Secondary | ICD-10-CM | POA: Diagnosis not present

## 2021-02-13 DIAGNOSIS — S82892C Other fracture of left lower leg, initial encounter for open fracture type IIIA, IIIB, or IIIC: Secondary | ICD-10-CM | POA: Diagnosis not present

## 2021-02-14 DIAGNOSIS — I96 Gangrene, not elsewhere classified: Secondary | ICD-10-CM | POA: Diagnosis not present

## 2021-02-14 DIAGNOSIS — S82892C Other fracture of left lower leg, initial encounter for open fracture type IIIA, IIIB, or IIIC: Secondary | ICD-10-CM | POA: Diagnosis not present

## 2021-02-14 DIAGNOSIS — Z89512 Acquired absence of left leg below knee: Secondary | ICD-10-CM | POA: Diagnosis not present

## 2021-02-14 DIAGNOSIS — M799 Soft tissue disorder, unspecified: Secondary | ICD-10-CM | POA: Diagnosis not present

## 2021-02-14 DIAGNOSIS — A419 Sepsis, unspecified organism: Secondary | ICD-10-CM | POA: Diagnosis not present

## 2021-02-14 DIAGNOSIS — S81802D Unspecified open wound, left lower leg, subsequent encounter: Secondary | ICD-10-CM | POA: Diagnosis not present

## 2021-02-14 DIAGNOSIS — G8918 Other acute postprocedural pain: Secondary | ICD-10-CM | POA: Diagnosis not present

## 2021-02-14 DIAGNOSIS — L089 Local infection of the skin and subcutaneous tissue, unspecified: Secondary | ICD-10-CM | POA: Diagnosis not present

## 2021-02-14 DIAGNOSIS — S8292XA Unspecified fracture of left lower leg, initial encounter for closed fracture: Secondary | ICD-10-CM | POA: Diagnosis not present

## 2021-02-14 DIAGNOSIS — D62 Acute posthemorrhagic anemia: Secondary | ICD-10-CM | POA: Diagnosis not present

## 2021-02-14 DIAGNOSIS — E871 Hypo-osmolality and hyponatremia: Secondary | ICD-10-CM | POA: Diagnosis not present

## 2021-02-15 DIAGNOSIS — S82892C Other fracture of left lower leg, initial encounter for open fracture type IIIA, IIIB, or IIIC: Secondary | ICD-10-CM | POA: Diagnosis not present

## 2021-02-15 DIAGNOSIS — E871 Hypo-osmolality and hyponatremia: Secondary | ICD-10-CM | POA: Diagnosis not present

## 2021-02-15 DIAGNOSIS — M79605 Pain in left leg: Secondary | ICD-10-CM | POA: Diagnosis not present

## 2021-02-15 DIAGNOSIS — G8918 Other acute postprocedural pain: Secondary | ICD-10-CM | POA: Diagnosis not present

## 2021-02-15 DIAGNOSIS — A419 Sepsis, unspecified organism: Secondary | ICD-10-CM | POA: Diagnosis not present

## 2021-02-15 DIAGNOSIS — D62 Acute posthemorrhagic anemia: Secondary | ICD-10-CM | POA: Diagnosis not present

## 2021-02-16 DIAGNOSIS — E871 Hypo-osmolality and hyponatremia: Secondary | ICD-10-CM | POA: Diagnosis not present

## 2021-02-16 DIAGNOSIS — S82892C Other fracture of left lower leg, initial encounter for open fracture type IIIA, IIIB, or IIIC: Secondary | ICD-10-CM | POA: Diagnosis not present

## 2021-02-16 DIAGNOSIS — M79605 Pain in left leg: Secondary | ICD-10-CM | POA: Diagnosis not present

## 2021-02-16 DIAGNOSIS — A419 Sepsis, unspecified organism: Secondary | ICD-10-CM | POA: Diagnosis not present

## 2021-02-16 DIAGNOSIS — G8918 Other acute postprocedural pain: Secondary | ICD-10-CM | POA: Diagnosis not present

## 2021-02-16 DIAGNOSIS — D62 Acute posthemorrhagic anemia: Secondary | ICD-10-CM | POA: Diagnosis not present

## 2021-02-17 DIAGNOSIS — G8918 Other acute postprocedural pain: Secondary | ICD-10-CM | POA: Diagnosis not present

## 2021-02-17 DIAGNOSIS — E871 Hypo-osmolality and hyponatremia: Secondary | ICD-10-CM | POA: Diagnosis not present

## 2021-02-17 DIAGNOSIS — D62 Acute posthemorrhagic anemia: Secondary | ICD-10-CM | POA: Diagnosis not present

## 2021-02-17 DIAGNOSIS — Z89612 Acquired absence of left leg above knee: Secondary | ICD-10-CM | POA: Diagnosis not present

## 2021-02-17 DIAGNOSIS — M79605 Pain in left leg: Secondary | ICD-10-CM | POA: Diagnosis not present

## 2021-02-17 DIAGNOSIS — A419 Sepsis, unspecified organism: Secondary | ICD-10-CM | POA: Diagnosis not present

## 2021-02-17 DIAGNOSIS — S82892C Other fracture of left lower leg, initial encounter for open fracture type IIIA, IIIB, or IIIC: Secondary | ICD-10-CM | POA: Diagnosis not present

## 2021-02-18 DIAGNOSIS — D62 Acute posthemorrhagic anemia: Secondary | ICD-10-CM | POA: Diagnosis not present

## 2021-02-18 DIAGNOSIS — R918 Other nonspecific abnormal finding of lung field: Secondary | ICD-10-CM | POA: Diagnosis not present

## 2021-02-18 DIAGNOSIS — S82892C Other fracture of left lower leg, initial encounter for open fracture type IIIA, IIIB, or IIIC: Secondary | ICD-10-CM | POA: Diagnosis not present

## 2021-02-19 ENCOUNTER — Telehealth: Payer: Self-pay

## 2021-02-19 NOTE — Telephone Encounter (Signed)
Transition Care Management Follow-up Telephone Call  Date of discharge and from where: 02/18/2021 from [facility].  How have you been since you were released from the hospital? Pt states that he is feeling over all good. Pt did not have questions or concerns.   Any questions or concerns? No  Items Reviewed:  Did the pt receive and understand the discharge instructions provided? Yes   Medications obtained and verified? Yes   Other? No   Any new allergies since your discharge? No   Dietary orders reviewed? n/a  Do you have support at home? Yes   Functional Questionnaire: (I = Independent and D = Dependent) ADLs: D  Bathing/Dressing- D - currently until post op appt.   Meal Prep- D  Eating- I  Maintaining continence- I  Transferring/Ambulation- D - currently non ambulatory until post op and PT/OT can be started/ordered by ortho.   Managing Meds- I   Follow up appointments reviewed:   PCP Hospital f/u appt confirmed? No  Pt is follow up with orthopedic surgeon after [facility]  Specialist Hospital f/u appt confirmed? Yes  Scheduled to see Forest Becker, MD on 04/01/2021 @ 09:45am.  Are transportation arrangements needed? No   If their condition worsens, is the pt aware to call PCP or go to the Emergency Dept.? Yes  Was the patient provided with contact information for the PCP's office or ED? Yes  Was to pt encouraged to call back with questions or concerns? Yes

## 2021-03-12 DIAGNOSIS — Z4802 Encounter for removal of sutures: Secondary | ICD-10-CM | POA: Diagnosis not present

## 2021-03-14 DIAGNOSIS — Z419 Encounter for procedure for purposes other than remedying health state, unspecified: Secondary | ICD-10-CM | POA: Diagnosis not present

## 2021-04-01 DIAGNOSIS — S78112D Complete traumatic amputation at level between left hip and knee, subsequent encounter: Secondary | ICD-10-CM | POA: Diagnosis not present

## 2021-04-13 DIAGNOSIS — Z419 Encounter for procedure for purposes other than remedying health state, unspecified: Secondary | ICD-10-CM | POA: Diagnosis not present

## 2021-05-14 DIAGNOSIS — Z419 Encounter for procedure for purposes other than remedying health state, unspecified: Secondary | ICD-10-CM | POA: Diagnosis not present

## 2021-06-12 DIAGNOSIS — Z89612 Acquired absence of left leg above knee: Secondary | ICD-10-CM | POA: Diagnosis not present

## 2021-06-13 DIAGNOSIS — Z419 Encounter for procedure for purposes other than remedying health state, unspecified: Secondary | ICD-10-CM | POA: Diagnosis not present

## 2021-06-26 IMAGING — DX DG PORTABLE PELVIS
1 series · 1 of 1 positions shown · non-contrast
Comparison: None.

CLINICAL DATA: Trauma

EXAM:
PORTABLE PELVIS 1-2 VIEWS

[pelvis ap]
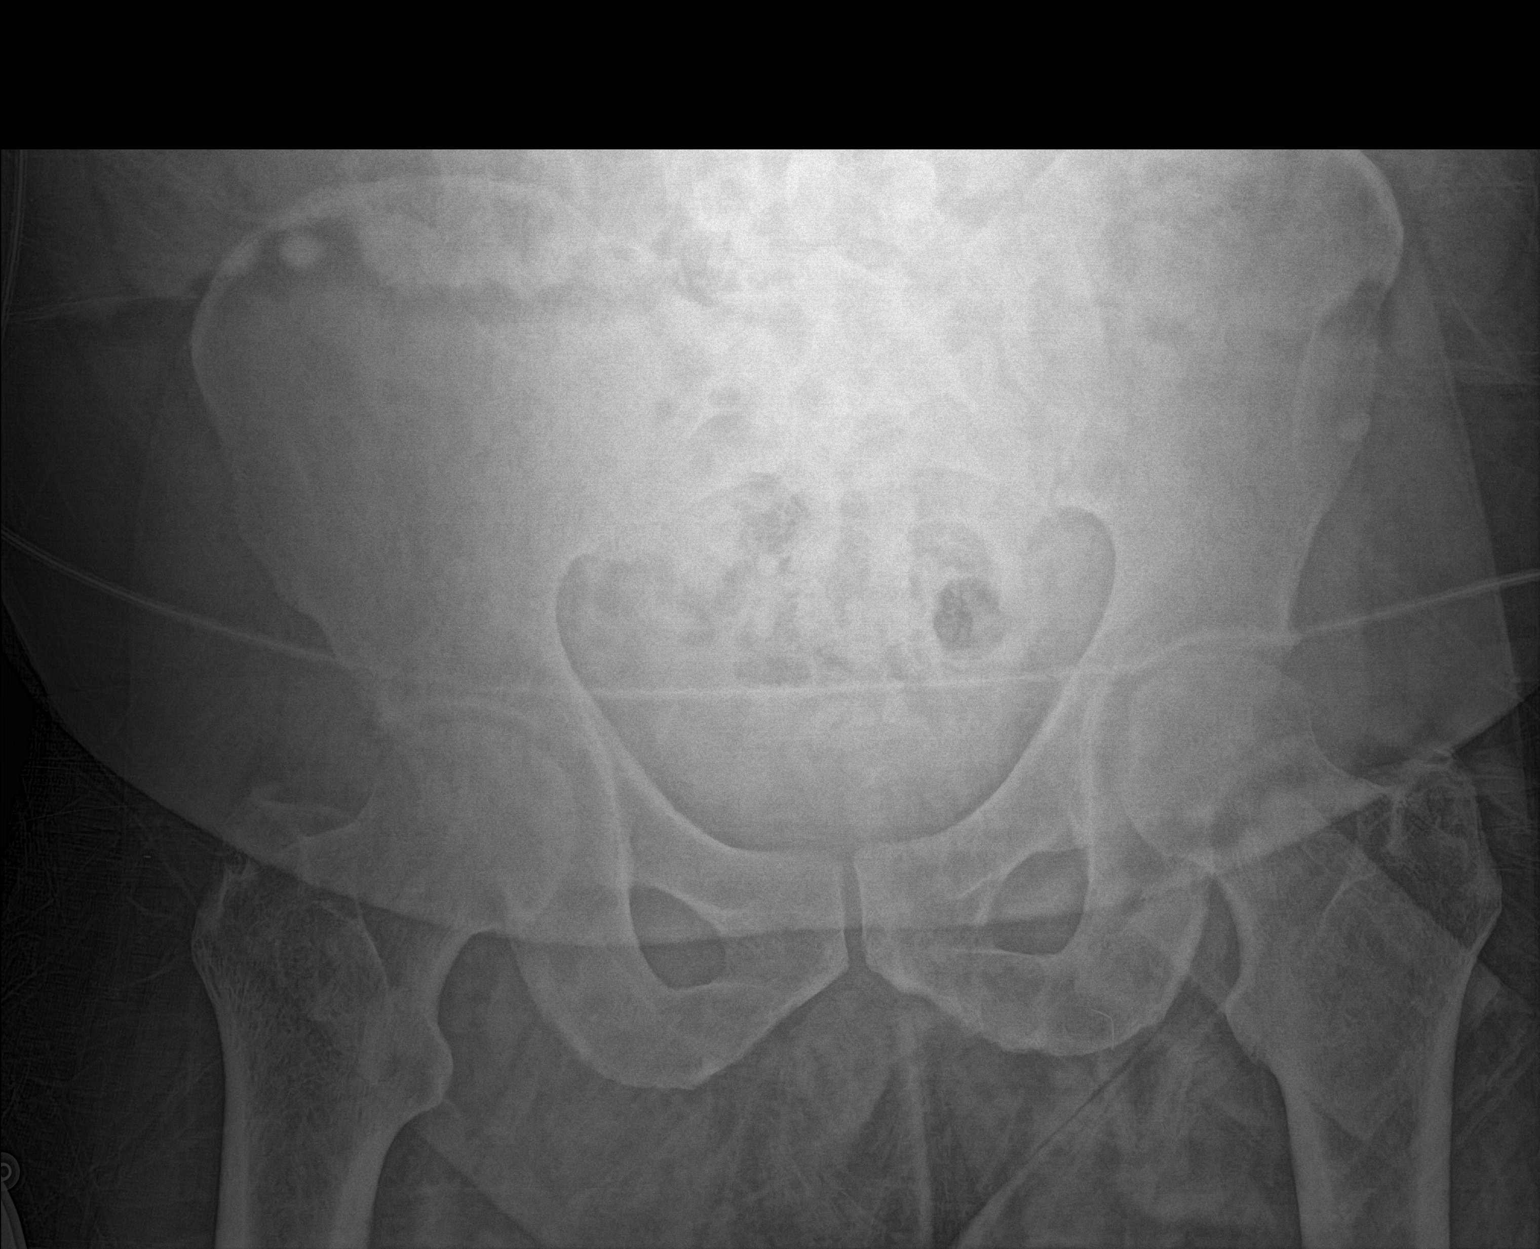

[1 of 1 positions shown; findings below may reference images not displayed]

FINDINGS: Supine frontal view of the pelvis was obtained, limited by body
habitus, artifact, and portable technique. No acute displaced
fracture. Mild symmetrical bilateral hip osteoarthritis. Soft
tissues are unremarkable.
IMPRESSION: 1. Osteoarthritis.  No acute fracture.
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
## 2021-07-14 DIAGNOSIS — Z419 Encounter for procedure for purposes other than remedying health state, unspecified: Secondary | ICD-10-CM | POA: Diagnosis not present

## 2021-08-06 ENCOUNTER — Ambulatory Visit: Payer: Medicaid Other | Attending: General Practice

## 2021-08-06 ENCOUNTER — Other Ambulatory Visit: Payer: Self-pay

## 2021-08-06 DIAGNOSIS — M6281 Muscle weakness (generalized): Secondary | ICD-10-CM | POA: Diagnosis not present

## 2021-08-06 DIAGNOSIS — R2689 Other abnormalities of gait and mobility: Secondary | ICD-10-CM

## 2021-08-06 DIAGNOSIS — R2681 Unsteadiness on feet: Secondary | ICD-10-CM | POA: Insufficient documentation

## 2021-08-06 DIAGNOSIS — R293 Abnormal posture: Secondary | ICD-10-CM | POA: Diagnosis not present
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
## 2021-08-14 DIAGNOSIS — Z419 Encounter for procedure for purposes other than remedying health state, unspecified: Secondary | ICD-10-CM | POA: Diagnosis not present

## 2021-08-20 ENCOUNTER — Ambulatory Visit: Payer: Medicaid Other | Admitting: Physical Therapy

## 2021-08-22 ENCOUNTER — Other Ambulatory Visit: Payer: Self-pay

## 2021-08-22 ENCOUNTER — Ambulatory Visit: Payer: Medicaid Other | Attending: General Practice

## 2021-08-22 DIAGNOSIS — R293 Abnormal posture: Secondary | ICD-10-CM | POA: Insufficient documentation

## 2021-08-22 DIAGNOSIS — R2689 Other abnormalities of gait and mobility: Secondary | ICD-10-CM | POA: Diagnosis not present

## 2021-08-22 DIAGNOSIS — M6281 Muscle weakness (generalized): Secondary | ICD-10-CM | POA: Diagnosis not present

## 2021-08-22 DIAGNOSIS — R2681 Unsteadiness on feet: Secondary | ICD-10-CM | POA: Diagnosis not present

## 2021-08-23 NOTE — Therapy (Signed)
[facility] [address] [place], [place], [address] Phone: [phone]   Fax:  [phone]  Physical Therapy Treatment  Patient Details  Name: Cory [name] MRN: [id number] Date of Birth: 08-05-1981 Referring Provider (PT): Forest Becker   Encounter Date: 08/22/2021   PT End of Session - 08/22/21 1535     Visit Number 2    Number of Visits 17    Date for PT Re-Evaluation 10/03/21    Authorization Type med pay/wellcare medicaid. 8 visit 9/7-10/5    Authorization - Visit Number 1    Authorization - Number of Visits 8    PT Start Time 1533    PT Stop Time 1614    PT Time Calculation (min) 41 min    Activity Tolerance Patient tolerated treatment well    Behavior During Therapy WFL for tasks assessed/performed             Past Medical History:  Diagnosis Date   Chronic headache    Low back pain    Morbid obesity (HCC)     History reviewed. No pertinent surgical history.  There were no vitals filed for this visit.   Subjective Assessment - 08/22/21 1535     Subjective Pt reports he is is doing well. Denies any falls. Reports that still has tight feeling in leg especially at night.    Patient Stated Goals Pt wants to be able to walk without cane.    Currently in Pain? Yes    Pain Score 8     Pain Location Leg    Pain Orientation Left    Pain Descriptors / Indicators Pressure    Pain Type Acute pain;Phantom pain    Pain Onset More than a month ago    Pain Frequency Intermittent                               [facility] Adult PT Treatment/Exercise - 08/22/21 1536       Ambulation/Gait   Ambulation/Gait Yes    Ambulation/Gait Assistance 5: Supervision;4: Min guard    Ambulation/Gait Assistance Details around in clinic during session    Assistive device Small based quad cane;Prosthesis    Gait Pattern Step-through pattern;Decreased stance time - left;Decreased step length -  right;Decreased weight shift to left;Trunk flexed    Ambulation Surface Level;Indoor    Gait Comments In // bars with visual and verbal cues to weight shift over prosthesis and stay up tall with bilateral UE support 8' x 4 then 1 UE support 8' x 4. Standing with RLE on 4" step working in increasing left weight shift with more upright posture reaching overhead x 5 with verbal and tactile cues.      Standardized Balance Assessment   Standardized Balance Assessment Berg Balance Test;Dynamic Gait Index      Berg Balance Test   Sit to Stand Able to stand without using hands and stabilize independently    Standing Unsupported Able to stand safely 2 minutes    Sitting with Back Unsupported but Feet Supported on Floor or Stool Able to sit safely and securely 2 minutes    Stand to Sit Sits safely with minimal use of hands    Transfers Able to transfer safely, minor use of hands    Standing Unsupported with Eyes Closed Able to stand 10 seconds safely    Standing Ubsupported with Feet Together Able to place feet together independently  and stand 1 minute safely    From Standing, Reach Forward with Outstretched Arm Can reach confidently >25 cm (10")    From Standing Position, Pick up Object from Floor Able to pick up shoe safely and easily    From Standing Position, Turn to Look Behind Over each Shoulder Looks behind from both sides and weight shifts well    Turn 360 Degrees Needs close supervision or verbal cueing    Standing Unsupported, Alternately Place Feet on Step/Stool Able to complete >2 steps/needs minimal assist    Standing Unsupported, One Foot in Front Able to plae foot ahead of the other independently and hold 30 seconds    Standing on One Leg Tries to lift leg/unable to hold 3 seconds but remains standing independently   RLE >10 sec   Total Score 46      Dynamic Gait Index   Level Surface Mild Impairment    Change in Gait Speed Mild Impairment    Gait with Horizontal Head Turns Mild  Impairment    Gait with Vertical Head Turns Mild Impairment    Gait and Pivot Turn Mild Impairment    Step Over Obstacle Moderate Impairment    Step Around Obstacles Mild Impairment    Steps Moderate Impairment    Total Score 14      Manual Therapy   Manual therapy comments In prone grade 4 PA femoral glides on left for extension 4 bouts of 20 sec. Discussed towel roll under distal femur when in prone.      Prosthetics   Prosthetic Care Comments  Pt reporting that he started using a powder at night for heat rash. Makes sure is dry prior to donning shrinker and wipes off in morning. Pt only removing liner 1  time during th day. PT discussed that he will need to remove at least 3x/day and pat skin and liner dry to try to help more with sweat/moisture that is contibuting to heat rash.    Current prosthetic wear tolerance (days/week)  daily    Current prosthetic wear tolerance (#hours/day)  all awake hours    Residual limb condition  heat rash anterior thigh.    Education Provided Skin check;Residual limb care    Person(s) Educated Patient    Education Method Explanation    Education Method Verbalized understanding                     PT Education - 08/23/21 1216     Education Details Sweat management. See prosthetic session. Discussed placing towel roll under distal femur when spending time in prone to get bigger stretch. Pt reports he is spending about 45 min in prone working on stretching. Also discussed weight shifting at counter to work on getting over prosthesis more.    Person(s) Educated Patient    Methods Explanation;Demonstration    Comprehension Verbalized understanding;Returned demonstration              PT Short Term Goals - 08/23/21 1218       PT SHORT TERM GOAL #1   Title Pt will be independent with prosthetic management for improved function.    Baseline needs education    Time 4    Period Weeks    Status New    Target Date 09/04/21      PT SHORT  TERM GOAL #2   Title Pt will be able to tolerate wearing prosthesis all awake hours with no skin issues for improved function.  Baseline currently wearing all day but is having skin issues due to sweating.    Time 4    Period Weeks    Status New    Target Date 09/04/21      PT SHORT TERM GOAL #3   Title Pt will ambulate >500' with quad cane on varied surfaces supervision for improved short community distances.    Baseline 08/06/21 79' with SBQC close supervision/CGA    Time 4    Period Weeks    Status New    Target Date 09/04/21      PT SHORT TERM GOAL #4   Title Pt will decrease TUG from 23.16 sec to <20 sec for improved balance and functional mobility.    Baseline 08/06/21  TUG=23.16 sec with quad cane    Time 4    Period Weeks    Status New    Target Date 09/04/21      PT SHORT TERM GOAL #5   Title Berg Balance and DGI will be performed and LTGs written.    Baseline not completed at eval. 08/22/21 both completed    Time 4    Period Weeks    Status Achieved    Target Date 09/04/21               PT Long Term Goals - 08/23/21 1218       PT LONG TERM GOAL #1   Title Pt will increase gait speed from 0.21m/s to >0.17m/s for improved community mobility.    Baseline 08/06/21 0.45m/s    Time 8    Period Weeks    Status New      PT LONG TERM GOAL #2   Title Pt will ambulate >800' on varied surfaces with no AD versus cane independently for improved community mobility.    Baseline 08/06/21 75' with SBQC supervision/CGA    Time 8    Period Weeks    Status New      PT LONG TERM GOAL #3   Title Pt will ambulate up/down ramp and curb independently for improved community access.    Baseline no performing    Time 8    Period Weeks    Status New      PT LONG TERM GOAL #4   Title Pt will ambulate up/down 8 steps mod I with cane for improved community access.    Baseline not assessed at eval    Time 8    Period Weeks    Status New      PT LONG TERM GOAL #5   Title Pt  will increase Berg from 46/56 to 50/56 for improved balance and decreased fall rik.    Baseline 08/22/21 46/56    Time 8    Period Weeks    Status New      PT LONG TERM GOAL #6   Title Pt will increase DGI from 14/24 to 19/24 for improved balance and gait safety.    Baseline 08/22/21 14/24    Time 8    Period Weeks    Status New                   Plan - 08/23/21 1220     Clinical Impression Statement PT performed Berg and DGI today. Pt fall risk with Berg score of 46/56 and DGI score of 14/24. PT began to work on increasing left weight shift during gait with pt. Pt does have increased left hip flexion as well that we also  started to work on.    Examination-Activity Limitations Locomotion Level;Transfers;Stairs;Stand    Examination-Participation Restrictions Community Activity;Driving;Occupation;Yard Work    Conservation officer, historic buildingstability/Clinical Decision Making Evolving/Moderate complexity    Rehab Potential Good    PT Frequency 2x / week   plus eval   PT Duration 8 weeks    PT Treatment/Interventions ADLs/Self Care Home Management;DME Instruction;Gait training;Stair training;Functional mobility training;Therapeutic activities;Therapeutic exercise;Balance training;Neuromuscular re-education;Manual techniques;Passive range of motion;Prosthetic Training;Vestibular;Patient/family education    PT Next Visit Plan Prosthetic education. How is skin doing as had heat rash last visit? Gait training with quad cane . Activities to promote more left weight shift. Balance training. Left hip flexor stretching and hip extensorabductor strengthening.    Consulted and Agree with Plan of Care Patient             Patient will benefit from skilled therapeutic intervention in order to improve the following deficits and impairments:  Decreased balance, Decreased endurance, Decreased range of motion, Impaired flexibility, Decreased strength, Decreased mobility, Decreased knowledge of use of DME, Prosthetic  Dependency  Visit Diagnosis: Other abnormalities of gait and mobility  Muscle weakness (generalized)  Abnormal posture  Unsteadiness on feet     Problem List Patient Active Problem List   Diagnosis Date Noted   Tibia fracture 02/07/2021   Morbid obesity (HCC)     Ronn MelenaEmily A Corin Formisano, PT, DPT, NCS 08/23/2021, 12:24 PM  [facility] [address] [place], [place], [address] Phone: [phone]   Fax:  [phone]  Name: Cory [name] MRN: [id number] Date of Birth: 02-Aug-1981

## 2021-08-27 ENCOUNTER — Ambulatory Visit: Payer: Medicaid Other | Admitting: Physical Therapy

## 2021-08-27 ENCOUNTER — Encounter: Payer: Self-pay | Admitting: Physical Therapy

## 2021-08-27 ENCOUNTER — Other Ambulatory Visit: Payer: Self-pay

## 2021-08-27 DIAGNOSIS — R2689 Other abnormalities of gait and mobility: Secondary | ICD-10-CM

## 2021-08-27 DIAGNOSIS — R293 Abnormal posture: Secondary | ICD-10-CM | POA: Diagnosis not present

## 2021-08-27 DIAGNOSIS — M6281 Muscle weakness (generalized): Secondary | ICD-10-CM | POA: Diagnosis not present

## 2021-08-27 DIAGNOSIS — R2681 Unsteadiness on feet: Secondary | ICD-10-CM | POA: Diagnosis not present

## 2021-08-28 NOTE — Therapy (Signed)
[facility] [address] [place], [place], [address] Phone: [phone]   Fax:  [phone]  Physical Therapy Treatment  Patient Details  Name: Cory [name] MRN: [id number] Date of Birth: 09-20-1981 Referring Provider (PT): Forest Becker   Encounter Date: 08/27/2021   PT End of Session - 08/27/21 1545     Visit Number 3    Number of Visits 17    Date for PT Re-Evaluation 10/03/21    Authorization Type med pay/wellcare medicaid. 8 visit 9/7-10/5    Authorization - Visit Number 2    Authorization - Number of Visits 8    PT Start Time 1535    PT Stop Time 1615    PT Time Calculation (min) 40 min    Equipment Utilized During Treatment Gait belt    Activity Tolerance Patient tolerated treatment well    Behavior During Therapy WFL for tasks assessed/performed             Past Medical History:  Diagnosis Date   Chronic headache    Low back pain    Morbid obesity (HCC)     History reviewed. No pertinent surgical history.  There were no vitals filed for this visit.   Subjective Assessment - 08/27/21 1537     Subjective Pt reports multiple falls since last session (3-4 times). Reports it happens when he gets to walking faster and knee can not extend fast enough. Had a fall this session walking into the gym.    Patient Stated Goals Pt wants to be able to walk without cane.    Currently in Pain? Yes    Pain Score 9     Pain Location Leg    Pain Orientation Left    Pain Descriptors / Indicators Sore;Pressure    Pain Type Acute pain;Phantom pain    Pain Onset More than a month ago    Pain Frequency Intermittent    Aggravating Factors  mostly during first couple of steps    Pain Relieving Factors resting                    [facility] Adult PT Treatment/Exercise - 08/27/21 1547       Transfers   Transfers Sit to Stand;Stand to Sit;Floor to Transfer    Sit to Stand 6: Modified independent  (Device/Increase time);With upper extremity assist    Stand to Sit 6: Modified independent (Device/Increase time)    Floor to Transfer 5: Supervision    Floor to Transfer Details (indicate cue type and reason) cues for technique to go from lying on floor to standing with UE support on mat table.      Ambulation/Gait   Ambulation/Gait Yes    Ambulation/Gait Assistance 5: Supervision;4: Min guard    Ambulation/Gait Assistance Details cues for ensuring heel strike with prosthesis with intital contact and to slow down to allow this to occur. pt tends to abduct to advance prosthesis at times, cues to pull straight through while allowing knee to bend with swing phase. cues for increased stance time on prosthesis as well.    Ambulation Distance (Feet) 115 Feet   x1, plus around clinic with session   Assistive device Small based quad cane;Prosthesis    Gait Pattern Step-through pattern;Decreased stance time - left;Decreased step length - right;Decreased weight shift to left;Trunk flexed    Ambulation Surface Level;Indoor    Stairs Yes    Stairs Assistance 5: Supervision    Stairs Assistance Details (indicate  cue type and reason) cues on weight shifitng and to adavance hand on rail with descending. cues on sequencing/technique with rail/cane combo    Stair Management Technique One rail Right;Step to pattern;Forwards;With cane    Number of Stairs 4   x2 reps   Height of Stairs 6    Ramp Other (comment)   min guard assist   Ramp Details (indicate cue type and reason) x2 reps with prosthsis/small base quad cane. cues on posture, step length and technique to maintain knee control.      Prosthetics   Current prosthetic wear tolerance (days/week)  daily    Current prosthetic wear tolerance (#hours/day)  all awake hours    Residual limb condition  heat rash anterior thigh. small blister at distal end of limb. Pt sholw how to apply Tegaderm to blister on distal end of limb. Pt also educated on use of sweat  block wipes once a week to help with heat rash and to decrease sweating which is causing heat rash. Pt given options for obtaining these wipes.    Education Provided Residual limb care;Proper wear schedule/adjustment;Proper weight-bearing schedule/adjustment    Person(s) Educated Patient    Education Method Explanation;Demonstration;Verbal cues;Handout    Education Method Verbalized understanding;Verbal cues required;Needs further instruction                  PT Short Term Goals - 08/23/21 1218       PT SHORT TERM GOAL #1   Title Pt will be independent with prosthetic management for improved function.    Baseline needs education    Time 4    Period Weeks    Status New    Target Date 09/04/21      PT SHORT TERM GOAL #2   Title Pt will be able to tolerate wearing prosthesis all awake hours with no skin issues for improved function.    Baseline currently wearing all day but is having skin issues due to sweating.    Time 4    Period Weeks    Status New    Target Date 09/04/21      PT SHORT TERM GOAL #3   Title Pt will ambulate >500' with quad cane on varied surfaces supervision for improved short community distances.    Baseline 08/06/21 46' with [facility] close supervision/CGA    Time 4    Period Weeks    Status New    Target Date 09/04/21      PT SHORT TERM GOAL #4   Title Pt will decrease TUG from 23.16 sec to <20 sec for improved balance and functional mobility.    Baseline 08/06/21  TUG=23.16 sec with quad cane    Time 4    Period Weeks    Status New    Target Date 09/04/21      PT SHORT TERM GOAL #5   Title Berg Balance and DGI will be performed and LTGs written.    Baseline not completed at eval. 08/22/21 both completed    Time 4    Period Weeks    Status Achieved    Target Date 09/04/21               PT Long Term Goals - 08/23/21 1218       PT LONG TERM GOAL #1   Title Pt will increase gait speed from 0.13m/s to >0.25m/s for improved community mobility.     Baseline 08/06/21 0.61m/s    Time 8    Period  Weeks    Status New      PT LONG TERM GOAL #2   Title Pt will ambulate >800' on varied surfaces with no AD versus cane independently for improved community mobility.    Baseline 08/06/21 75' with [facility] supervision/CGA    Time 8    Period Weeks    Status New      PT LONG TERM GOAL #3   Title Pt will ambulate up/down ramp and curb independently for improved community access.    Baseline no performing    Time 8    Period Weeks    Status New      PT LONG TERM GOAL #4   Title Pt will ambulate up/down 8 steps mod I with cane for improved community access.    Baseline not assessed at eval    Time 8    Period Weeks    Status New      PT LONG TERM GOAL #5   Title Pt will increase Berg from 46/56 to 50/56 for improved balance and decreased fall rik.    Baseline 08/22/21 46/56    Time 8    Period Weeks    Status New      PT LONG TERM GOAL #6   Title Pt will increase DGI from 14/24 to 19/24 for improved balance and gait safety.    Baseline 08/22/21 14/24    Time 8    Period Weeks    Status New                   Plan - 08/27/21 2209     Clinical Impression Statement Today's skilled session initially focused on fall recovery as pt had a fall with walking into the gym from the lobby when prosthesis was not fully locked in stance phase. Pt denied any injuries. Residual limb checked as pt landed on lateral aspect of prosthetic socket. No bruising noted at this time, pt advised to monitor limb. Pt able to get back to standing with supervision with cues. Pt reported having several falls since last session, anytime he tries to speed up his walking speed and prosthetic knee not stable underneath him. Pt most likely walking too fast to allow knee swing hydraulics to fully extend with swing phase. Call placed to [facility] with next available appt next Tuesday at 3:15. Remainder of session focused on prosthetic/limb education and  gait/barriers with prosthesis/RW. No further issues noted or reported in session. The pt is making steady progress toward goals and should benefit from continued PT to progress toward unmet goals.    Examination-Activity Limitations Locomotion Level;Transfers;Stairs;Stand    Examination-Participation Restrictions Community Activity;Driving;Occupation;Yard Work    Conservation officer, historic buildings Evolving/Moderate complexity    Rehab Potential Good    PT Frequency 2x / week   plus eval   PT Duration 8 weeks    PT Treatment/Interventions ADLs/Self Care Home Management;DME Instruction;Gait training;Stair training;Functional mobility training;Therapeutic activities;Therapeutic exercise;Balance training;Neuromuscular re-education;Manual techniques;Passive range of motion;Prosthetic Training;Vestibular;Patient/family education    PT Next Visit Plan Any issues after multiple falls, including at last session? Prosthetic education. How is skin doing as had heat rash last visit? Gait training with quad cane . Activities to promote more left weight shift. Balance training. Left hip flexor stretching and hip extensor, abductor strengthening.    Consulted and Agree with Plan of Care Patient             Patient will benefit from skilled therapeutic intervention in order to improve  the following deficits and impairments:  Decreased balance, Decreased endurance, Decreased range of motion, Impaired flexibility, Decreased strength, Decreased mobility, Decreased knowledge of use of DME, Prosthetic Dependency  Visit Diagnosis: Other abnormalities of gait and mobility  Muscle weakness (generalized)  Abnormal posture  Unsteadiness on feet     Problem List Patient Active Problem List   Diagnosis Date Noted   Tibia fracture 02/07/2021   Morbid obesity (HCC)     Sallyanne Kuster, PTA, [facility] [address], [address] [place], [place] [address] [phone] 08/28/21, 10:23 PM    Name: Cory [name] MRN: [id number] Date of Birth: 11-07-1981

## 2021-08-29 ENCOUNTER — Ambulatory Visit: Payer: Medicaid Other

## 2021-09-03 ENCOUNTER — Encounter: Payer: Self-pay | Admitting: Physical Therapy

## 2021-09-03 ENCOUNTER — Other Ambulatory Visit: Payer: Self-pay

## 2021-09-03 ENCOUNTER — Ambulatory Visit: Payer: Medicaid Other | Admitting: Physical Therapy

## 2021-09-03 DIAGNOSIS — M6281 Muscle weakness (generalized): Secondary | ICD-10-CM

## 2021-09-03 DIAGNOSIS — R293 Abnormal posture: Secondary | ICD-10-CM

## 2021-09-03 DIAGNOSIS — R2681 Unsteadiness on feet: Secondary | ICD-10-CM

## 2021-09-03 DIAGNOSIS — R2689 Other abnormalities of gait and mobility: Secondary | ICD-10-CM

## 2021-09-04 NOTE — Therapy (Signed)
[facility] [address] [place], [place], [address] Phone: [phone]   Fax:  [phone]  Physical Therapy Treatment  Patient Details  Name: Cory [name] MRN: [id number] Date of Birth: 04-21-1981 Referring Provider (PT): Val Eagle   Encounter Date: 09/03/2021   PT End of Session - 09/03/21 1536     Visit Number 4    Number of Visits 17    Date for PT Re-Evaluation 10/03/21    Authorization Type med pay/wellcare medicaid. 8 visit 9/7-10/5    Authorization - Visit Number 3    Authorization - Number of Visits 8    PT Start Time 3295    PT Stop Time 1613    PT Time Calculation (min) 40 min    Equipment Utilized During Treatment Gait belt    Activity Tolerance Patient tolerated treatment well    Behavior During Therapy WFL for tasks assessed/performed             Past Medical History:  Diagnosis Date   Chronic headache    Low back pain    Morbid obesity ([place])     History reviewed. No pertinent surgical history.  There were no vitals filed for this visit.   Subjective Assessment - 09/03/21 1535     Subjective No new complaints. No new falls. Some low back pain, chronic with no change. Reports heat rash almost gone. Was unable make to [facility] yesterday, now see's Gerald Stabs tomorrow morning at 10 am for adjustments.    Patient Stated Goals Pt wants to be able to walk without cane.    Currently in Pain? No/denies    Pain Score 0-No pain                     [facility] Adult PT Treatment/Exercise - 09/03/21 1538       Transfers   Transfers Sit to Stand;Stand to Sit    Sit to Stand 6: Modified independent (Device/Increase time);With upper extremity assist    Stand to Sit 6: Modified independent (Device/Increase time)      Ambulation/Gait   Ambulation/Gait Yes    Ambulation/Gait Assistance 5: Supervision    Ambulation/Gait Assistance Details cues for decreased circumduction, increased  prosthetic stance time and increased weight shifing onto prosthesis in stance time.    Ambulation Distance (Feet) --   around clinci with session   Assistive device Small based quad cane;Prosthesis    Gait Pattern Step-through pattern;Decreased stance time - left;Decreased step length - right;Decreased weight shift to left;Trunk flexed    Ambulation Surface Level;Indoor      High Level Balance   High Level Balance Comments in parallel bars with light UE to no UE support :3-4 laps each with cues on posture, technique and seqencing.      Knee/Hip Exercises: Aerobic   Other Aerobic Scifit mostly LEs only, occasional UE use, on level 3.5 x 8 minutes with goal >/= 100 steps per minute for strengthening and activitiy tolerance. HR 104 bpm before, 117 bpm after      Prosthetics   Current prosthetic wear tolerance (days/week)  daily    Current prosthetic wear tolerance (#hours/day)  all awake hours    Residual limb condition  heat rash mostly gone. Blister has competly healed per pt report. Itching has cleared up as well.    Education Provided Residual limb care    Person(s) Educated Patient    Education Method Explanation;Demonstration;Verbal cues    Education Method Verbalized understanding;Returned  demonstration;Verbal cues required;Needs further instruction    Donning Prosthesis Modified independent (device/increased time)    Doffing Prosthesis Modified independent (device/increased time)                 Balance Exercises - 09/03/21 1608       Balance Exercises: Standing   Rockerboard Anterior/posterior;Lateral;Head turns;EC;30 seconds;Other reps (comment);Limitations;Intermittent UE support    Rockerboard Limitations performed both ways on balance board while holding board steady: EC 30 sec's x 3 reps, progressing to EC head movements left<>right, then up<>down for ~10 reps each. min to mod assist for balance with cues on posture and weight shifting to assist with balance.                   PT Short Term Goals - 09/03/21 2209       PT SHORT TERM GOAL #1   Title Pt will be independent with prosthetic management for improved function.    Baseline 09/03/21: met with familiar concepts. will need assist with unfamiliar concepts    Status Achieved    Target Date 09/04/21      PT SHORT TERM GOAL #2   Title Pt will be able to tolerate wearing prosthesis all awake hours with no skin issues for improved function.    Baseline 09/04/21 wear time met, pt has heat rash that is clearing up.    Status Partially Met      PT SHORT TERM GOAL #3   Title Pt will ambulate >500' with quad cane on varied surfaces supervision for improved short community distances.    Baseline 08/06/21 64' with [facility] close supervision/CGA    Time 4    Period Weeks    Status On-going    Target Date 09/04/21      PT SHORT TERM GOAL #4   Title Pt will decrease TUG from 23.16 sec to <20 sec for improved balance and functional mobility.    Baseline 08/06/21  TUG=23.16 sec with quad cane    Time 4    Period Weeks    Status On-going    Target Date 09/04/21      PT SHORT TERM GOAL #5   Title Berg Balance and DGI will be performed and LTGs written.    Baseline not completed at eval. 08/22/21 both completed    Time 4    Period Weeks    Status On-going    Target Date 09/04/21               PT Long Term Goals - 08/23/21 1218       PT LONG TERM GOAL #1   Title Pt will increase gait speed from 0.60ms to >0.860m for improved community mobility.    Baseline 08/06/21 0.5275m   Time 8    Period Weeks    Status New      PT LONG TERM GOAL #2   Title Pt will ambulate >800' on varied surfaces with no AD versus cane independently for improved community mobility.    Baseline 08/06/21 75' with [facility] supervision/CGA    Time 8    Period Weeks    Status New      PT LONG TERM GOAL #3   Title Pt will ambulate up/down ramp and curb independently for improved community access.    Baseline no  performing    Time 8    Period Weeks    Status New      PT LONG TERM GOAL #4   Title  Pt will ambulate up/down 8 steps mod I with cane for improved community access.    Baseline not assessed at eval    Time 8    Period Weeks    Status New      PT LONG TERM GOAL #5   Title Pt will increase Berg from 46/56 to 50/56 for improved balance and decreased fall rik.    Baseline 08/22/21 46/56    Time 8    Period Weeks    Status New      PT LONG TERM GOAL #6   Title Pt will increase DGI from 14/24 to 19/24 for improved balance and gait safety.    Baseline 08/22/21 14/24    Time 8    Period Weeks    Status New                   Plan - 09/03/21 1536     Clinical Impression Statement Today's skilled session intially focused on progress toward STGs with goals 1 and 2 met. Deferred other goals till next session which will be after pt has has prosthesis adjusted. Remainder of session focused on strengthening and balance training with rest breaks taken as needed due to fatigue. The pt is progressing toward goals and should benefit from continued PT to progress toward unmet goals.    Examination-Activity Limitations Locomotion Level;Transfers;Stairs;Stand    Examination-Participation Restrictions Community Activity;Driving;Occupation;Yard Work    [employer]chant [employer] officer Evolving/Moderate complexity    Rehab Potential Good    PT Frequency 2x / week   plus eval   PT Duration 8 weeks    PT Treatment/Interventions ADLs/Self Care Home Management;DME Instruction;Gait training;Stair training;Functional mobility training;Therapeutic activities;Therapeutic exercise;Balance training;Neuromuscular re-education;Manual techniques;Passive range of motion;Prosthetic Training;Vestibular;Patient/family education    PT Next Visit Plan Gait training with quad cane . Activities to promote more left weight shift. Balance training  and hip extensor, abductor strengthening.    Consulted and Agree with  Plan of Care Patient             Patient will benefit from skilled therapeutic intervention in order to improve the following deficits and impairments:  Decreased balance, Decreased endurance, Decreased range of motion, Impaired flexibility, Decreased strength, Decreased mobility, Decreased knowledge of use of DME, Prosthetic Dependency  Visit Diagnosis: Other abnormalities of gait and mobility  Muscle weakness (generalized)  Abnormal posture  Unsteadiness on feet     Problem List Patient Active Problem List   Diagnosis Date Noted   Tibia fracture 02/07/2021   Morbid obesity ([place])     Willow Ora, PTA, [facility] [address] [place], [place] [address] [phone] 09/04/21, 10:14 PM   Name: Cory [name] MRN: [id number] Date of Birth: Dec 16, 1980

## 2021-09-05 ENCOUNTER — Other Ambulatory Visit: Payer: Self-pay

## 2021-09-05 ENCOUNTER — Ambulatory Visit: Payer: Medicaid Other

## 2021-09-05 DIAGNOSIS — R293 Abnormal posture: Secondary | ICD-10-CM | POA: Diagnosis not present

## 2021-09-05 DIAGNOSIS — R2689 Other abnormalities of gait and mobility: Secondary | ICD-10-CM | POA: Diagnosis not present

## 2021-09-05 DIAGNOSIS — R2681 Unsteadiness on feet: Secondary | ICD-10-CM

## 2021-09-05 DIAGNOSIS — M6281 Muscle weakness (generalized): Secondary | ICD-10-CM | POA: Diagnosis not present

## 2021-09-05 NOTE — Therapy (Signed)
[facility] [address] [place], [place], [address] Phone: [phone]   Fax:  [phone]  Physical Therapy Treatment  Patient Details  Name: Cory [name] MRN: [id number] Date of Birth: Jun 01, 1981 Referring Provider (PT): Val Eagle   Encounter Date: 09/05/2021   PT End of Session - 09/05/21 1538     Visit Number 5    Number of Visits 17    Date for PT Re-Evaluation 10/03/21    Authorization Type med pay/wellcare medicaid. 8 visit 9/7-10/5    Authorization - Visit Number 4    Authorization - Number of Visits 8    PT Start Time 1535    PT Stop Time 6283    PT Time Calculation (min) 38 min    Equipment Utilized During Treatment Gait belt    Activity Tolerance Patient tolerated treatment well    Behavior During Therapy WFL for tasks assessed/performed             Past Medical History:  Diagnosis Date   Chronic headache    Low back pain    Morbid obesity ([place])     History reviewed. No pertinent surgical history.  There were no vitals filed for this visit.   Subjective Assessment - 09/05/21 1540     Subjective Pt saw prosthetist, Gerald Stabs. Chris adjusted knee into some flexion and put foot in neutral which essentially shortened his prosthetist. He was trying to stretch out hip some.    Patient Stated Goals Pt wants to be able to walk without cane.    Currently in Pain? No/denies                               [facility] Adult PT Treatment/Exercise - 09/05/21 1549       Ambulation/Gait   Ambulation/Gait Yes    Ambulation/Gait Assistance 5: Supervision    Ambulation/Gait Assistance Details Pt was cued to try to ride prosthesis back all the way to allow toe off for better swing through.    Ambulation Distance (Feet) 575 Feet    Assistive device Small based quad cane;Prosthesis    Gait Pattern Step-through pattern;Decreased weight shift to left    Ambulation Surface Level;Indoor       Standardized Balance Assessment   Standardized Balance Assessment Timed Up and Go Test      Timed Up and Go Test   TUG Normal TUG    Normal TUG (seconds) 17.59      Neuro Re-ed    Neuro Re-ed Details  In // bars with visual feedback in mirror: tapping 4" step with RLE x 10 to increase left weight shift with 1 UE support. Standing with RLE on step x 1 min with reaching overhead with LUE x 5.  Pt was cued to thighen gluts in stance to stay up tall and not let him come out. Standing on rockerboard positioned lateral trying to maintain level x 30 sec eyes open and 30 sec eyes closed, rocking board x 10. Pt reported balance activities were very tiring.                       PT Short Term Goals - 09/05/21 1932       PT SHORT TERM GOAL #1   Title Pt will be independent with prosthetic management for improved function.    Baseline 09/03/21: met with familiar concepts. will need assist with unfamiliar concepts  Status Achieved    Target Date 09/04/21      PT SHORT TERM GOAL #2   Title Pt will be able to tolerate wearing prosthesis all awake hours with no skin issues for improved function.    Baseline 09/04/21 wear time met, pt has heat rash that is clearing up.    Status Partially Met      PT SHORT TERM GOAL #3   Title Pt will ambulate >500' with quad cane on varied surfaces supervision for improved short community distances.    Baseline 08/06/21 75' with [facility] close supervision/CGA. 09/05/21 575' on level surfaces supervision    Time 4    Period Weeks    Status Partially Met    Target Date 09/04/21      PT SHORT TERM GOAL #4   Title Pt will decrease TUG from 23.16 sec to <20 sec for improved balance and functional mobility.    Baseline 08/06/21  TUG=23.16 sec with quad cane. 09/05/21 17.59 sec    Time 4    Period Weeks    Status Achieved    Target Date 09/04/21      PT SHORT TERM GOAL #5   Title Berg Balance and DGI will be performed and LTGs written.    Baseline not  completed at eval. 08/22/21 both completed    Time 4    Period Weeks    Status Achieved    Target Date 09/04/21               PT Long Term Goals - 08/23/21 1218       PT LONG TERM GOAL #1   Title Pt will increase gait speed from 0.29ms to >0.828m for improved community mobility.    Baseline 08/06/21 0.5233m   Time 8    Period Weeks    Status New      PT LONG TERM GOAL #2   Title Pt will ambulate >800' on varied surfaces with no AD versus cane independently for improved community mobility.    Baseline 08/06/21 75' with [facility] supervision/CGA    Time 8    Period Weeks    Status New      PT LONG TERM GOAL #3   Title Pt will ambulate up/down ramp and curb independently for improved community access.    Baseline no performing    Time 8    Period Weeks    Status New      PT LONG TERM GOAL #4   Title Pt will ambulate up/down 8 steps mod I with cane for improved community access.    Baseline not assessed at eval    Time 8    Period Weeks    Status New      PT LONG TERM GOAL #5   Title Pt will increase Berg from 46/56 to 50/56 for improved balance and decreased fall rik.    Baseline 08/22/21 46/56    Time 8    Period Weeks    Status New      PT LONG TERM GOAL #6   Title Pt will increase DGI from 14/24 to 19/24 for improved balance and gait safety.    Baseline 08/22/21 14/24    Time 8    Period Weeks    Status New                   Plan - 09/05/21 1934     Clinical Impression Statement PT assessed remaining STGs. Pt met TUG  goal showing improving balance and functional mobility. Met gait distances on level surfaces at supervision level but have not assessed on nonlevel. PT was providing more cuing due to adjustments made by prosthetist lately. Much better prosthetic advancement today with less circumduction. Pt was challenged with weight shifting/balance activities.    Examination-Activity Limitations Locomotion Level;Transfers;Stairs;Stand     Examination-Participation Restrictions Community Activity;Driving;Occupation;Yard Work    [employer] officer Evolving/Moderate complexity    Rehab Potential Good    PT Frequency 2x / week   plus eval   PT Duration 8 weeks    PT Treatment/Interventions ADLs/Self Care Home Management;DME Instruction;Gait training;Stair training;Functional mobility training;Therapeutic activities;Therapeutic exercise;Balance training;Neuromuscular re-education;Manual techniques;Passive range of motion;Prosthetic Training;Vestibular;Patient/family education    PT Next Visit Plan Gait training with quad cane . Activities to promote more left weight shift. Balance training  and hip extensor, abductor strengthening.    Consulted and Agree with Plan of Care Patient             Patient will benefit from skilled therapeutic intervention in order to improve the following deficits and impairments:  Decreased balance, Decreased endurance, Decreased range of motion, Impaired flexibility, Decreased strength, Decreased mobility, Decreased knowledge of use of DME, Prosthetic Dependency  Visit Diagnosis: Other abnormalities of gait and mobility  Muscle weakness (generalized)  Unsteadiness on feet     Problem List Patient Active Problem List   Diagnosis Date Noted   Tibia fracture 02/07/2021   Morbid obesity ([place])     Electa Sniff, PT, DPT, NCS 09/05/2021, 7:37 PM  [facility] [address] [place], [place], [address] Phone: [phone]   Fax:  [phone]  Name: Cory [name] MRN: [id number] Date of Birth: Oct 23, 1981

## 2021-09-10 ENCOUNTER — Other Ambulatory Visit: Payer: Self-pay

## 2021-09-10 ENCOUNTER — Ambulatory Visit: Payer: Medicaid Other | Admitting: Physical Therapy

## 2021-09-10 ENCOUNTER — Encounter: Payer: Self-pay | Admitting: Physical Therapy

## 2021-09-10 DIAGNOSIS — M6281 Muscle weakness (generalized): Secondary | ICD-10-CM | POA: Diagnosis not present

## 2021-09-10 DIAGNOSIS — R293 Abnormal posture: Secondary | ICD-10-CM | POA: Diagnosis not present

## 2021-09-10 DIAGNOSIS — R2689 Other abnormalities of gait and mobility: Secondary | ICD-10-CM

## 2021-09-10 DIAGNOSIS — R2681 Unsteadiness on feet: Secondary | ICD-10-CM

## 2021-09-10 NOTE — Therapy (Signed)
[facility] [address] [place], [place], [address] Phone: [phone]   Fax:  [phone]  Physical Therapy Treatment  Patient Details  Name: Cory [name] MRN: [id number] Date of Birth: [birth year] Referring Provider (PT): Val Eagle   Encounter Date: 09/10/2021   PT End of Session - 09/10/21 1542     Visit Number 6    Number of Visits 17    Date for PT Re-Evaluation 10/03/21    Authorization Type med pay/wellcare medicaid. 8 visit 9/7-10/5/22    Authorization - Visit Number 5    Authorization - Number of Visits 8    PT Start Time 1540   pt running late for session   PT Stop Time 1615    PT Time Calculation (min) 35 min    Equipment Utilized During Treatment Gait belt    Activity Tolerance Patient tolerated treatment well    Behavior During Therapy WFL for tasks assessed/performed             Past Medical History:  Diagnosis Date   Chronic headache    Low back pain    Morbid obesity ([place])     History reviewed. No pertinent surgical history.  There were no vitals filed for this visit.   Subjective Assessment - 09/10/21 1542     Subjective No new complaints. No falls. "Usual" back pain.    Patient Stated Goals Pt wants to be able to walk without cane.    Currently in Pain? No/denies    Pain Score 0-No pain                   [facility] Adult PT Treatment/Exercise - 09/10/21 1544       Transfers   Transfers Sit to Stand;Stand to Sit    Sit to Stand 6: Modified independent (Device/Increase time);With upper extremity assist    Stand to Sit 6: Modified independent (Device/Increase time)      Ambulation/Gait   Ambulation/Gait Yes    Ambulation/Gait Assistance 5: Supervision    Ambulation/Gait Assistance Details pt continues to demo good swing through with no circumduction. trialed straight cane with rubber quad tip with no issues noted or reported.    Ambulation Distance (Feet) 115 Feet    x1 with cane with rubber quad tip at end of session; around clinic with small based quad cane prior to this.   Assistive device Small based quad cane;Prosthesis;Straight cane   cane with rubber quad tip   Gait Pattern Step-through pattern;Decreased weight shift to left    Ambulation Surface Level;Indoor      High Level Balance   High Level Balance Activities Side stepping    High Level Balance Comments in parallel bars for 3 laps each way with no UE support (hands hovering over bars) with cues on posture and technque.      Neuro Re-ed    Neuro Re-ed Details  for balance/muscle re-ed: gait along ~50 foot hallway with small base quad cane/prosthesis- forward gait with head movements left<>forward<>right, up<>forward<>down for 2 laps each. min guard assist with veering noted at times, no balance loss noted.      Prosthetics   Current prosthetic wear tolerance (days/week)  daily    Current prosthetic wear tolerance (#hours/day)  all awake hours    Residual limb condition  reports heat rash is almost completely gone    Education Provided Proper weight-bearing schedule/adjustment    Person(s) Educated Patient    Education Method Explanation;Demonstration;Verbal cues  Education Method Verbalized understanding;Returned demonstration;Verbal cues required;Needs further instruction    Donning Prosthesis Modified independent (device/increased time)    Doffing Prosthesis Modified independent (device/increased time)                 Balance Exercises - 09/10/21 1605       Balance Exercises: Standing   Rockerboard Anterior/posterior;Lateral;Head turns;EC;30 seconds;Other reps (comment);Limitations;Intermittent UE support    Rockerboard Limitations performed both ways on balance board while holding board steady: alternating UE raises, progressing to bil UE raises for 5-6 reps each, min guard assist; then  EC 30 sec's x 2-3 reps, progressing to EC head movements left<>right, then up<>down for  ~10 reps each. min to mod assist for balance with cues on posture and weight shifting to assist with balance.                  PT Short Term Goals - 09/05/21 1932       PT SHORT TERM GOAL #1   Title Pt will be independent with prosthetic management for improved function.    Baseline 09/03/21: met with familiar concepts. will need assist with unfamiliar concepts    Status Achieved    Target Date 09/04/21      PT SHORT TERM GOAL #2   Title Pt will be able to tolerate wearing prosthesis all awake hours with no skin issues for improved function.    Baseline 09/04/21 wear time met, pt has heat rash that is clearing up.    Status Partially Met      PT SHORT TERM GOAL #3   Title Pt will ambulate >500' with quad cane on varied surfaces supervision for improved short community distances.    Baseline 08/06/21 75' with [facility] close supervision/CGA. 09/05/21 575' on level surfaces supervision    Time 4    Period Weeks    Status Partially Met    Target Date 09/04/21      PT SHORT TERM GOAL #4   Title Pt will decrease TUG from 23.16 sec to <20 sec for improved balance and functional mobility.    Baseline 08/06/21  TUG=23.16 sec with quad cane. 09/05/21 17.59 sec    Time 4    Period Weeks    Status Achieved    Target Date 09/04/21      PT SHORT TERM GOAL #5   Title Berg Balance and DGI will be performed and LTGs written.    Baseline not completed at eval. 08/22/21 both completed    Time 4    Period Weeks    Status Achieved    Target Date 09/04/21               PT Long Term Goals - 08/23/21 1218       PT LONG TERM GOAL #1   Title Pt will increase gait speed from 0.28ms to >0.856m for improved community mobility.    Baseline 08/06/21 0.5275m   Time 8    Period Weeks    Status New      PT LONG TERM GOAL #2   Title Pt will ambulate >800' on varied surfaces with no AD versus cane independently for improved community mobility.    Baseline 08/06/21 75' with [facility] supervision/CGA     Time 8    Period Weeks    Status New      PT LONG TERM GOAL #3   Title Pt will ambulate up/down ramp and curb independently for improved community access.  Baseline no performing    Time 8    Period Weeks    Status New      PT LONG TERM GOAL #4   Title Pt will ambulate up/down 8 steps mod I with cane for improved community access.    Baseline not assessed at eval    Time 8    Period Weeks    Status New      PT LONG TERM GOAL #5   Title Pt will increase Berg from 46/56 to 50/56 for improved balance and decreased fall rik.    Baseline 08/22/21 46/56    Time 8    Period Weeks    Status New      PT LONG TERM GOAL #6   Title Pt will increase DGI from 14/24 to 19/24 for improved balance and gait safety.    Baseline 08/22/21 14/24    Time 8    Period Weeks    Status New                   Plan - 09/10/21 1543     Clinical Impression Statement Today's skilled session continued to focus on gait with prosthesis and balance training. Trialed use of straight cane with rubber quad tip with gait with no issues noted. Pt continues to need up to min assist with balance on compliant surfaces and with vision removed. The pt is progressing toward goals and should benefit from continued PT to progress toward unmet goals.    Examination-Activity Limitations Locomotion Level;Transfers;Stairs;Stand    Examination-Participation Restrictions Community Activity;Driving;Occupation;Yard Work    [employer] officer Evolving/Moderate complexity    Rehab Potential Good    PT Frequency 2x / week   plus eval   PT Duration 8 weeks    PT Treatment/Interventions ADLs/Self Care Home Management;DME Instruction;Gait training;Stair training;Functional mobility training;Therapeutic activities;Therapeutic exercise;Balance training;Neuromuscular re-education;Manual techniques;Passive range of motion;Prosthetic Training;Vestibular;Patient/family education    PT Next Visit Plan Gait training  with cane with rubber quad tip . Activities to promote more left weight shift. Balance training  and hip extensor, abductor strengthening.    Consulted and Agree with Plan of Care Patient             Patient will benefit from skilled therapeutic intervention in order to improve the following deficits and impairments:  Decreased balance, Decreased endurance, Decreased range of motion, Impaired flexibility, Decreased strength, Decreased mobility, Decreased knowledge of use of DME, Prosthetic Dependency  Visit Diagnosis: Other abnormalities of gait and mobility  Muscle weakness (generalized)  Unsteadiness on feet  Abnormal posture     Problem List Patient Active Problem List   Diagnosis Date Noted   Tibia fracture 02/07/2021   Morbid obesity [facility])     Willow Ora, PTA 09/10/2021, 8:37 PM  [facility] [address] [place], [place], [address] Phone: [phone]   Fax:  [phone]  Name: Cory [name] MRN: [id number] Date of Birth: 04-29-1981

## 2021-09-12 ENCOUNTER — Ambulatory Visit: Payer: Medicaid Other

## 2021-09-13 DIAGNOSIS — Z419 Encounter for procedure for purposes other than remedying health state, unspecified: Secondary | ICD-10-CM | POA: Diagnosis not present

## 2021-09-17 ENCOUNTER — Ambulatory Visit: Payer: Medicaid Other | Attending: General Practice

## 2021-09-17 ENCOUNTER — Other Ambulatory Visit: Payer: Self-pay

## 2021-09-17 DIAGNOSIS — R2681 Unsteadiness on feet: Secondary | ICD-10-CM

## 2021-09-17 DIAGNOSIS — R293 Abnormal posture: Secondary | ICD-10-CM | POA: Diagnosis not present

## 2021-09-17 DIAGNOSIS — R2689 Other abnormalities of gait and mobility: Secondary | ICD-10-CM

## 2021-09-17 DIAGNOSIS — M6281 Muscle weakness (generalized): Secondary | ICD-10-CM | POA: Diagnosis not present

## 2021-09-17 NOTE — Therapy (Signed)
[facility] [address] [place], [place], [address] Phone: [phone]   Fax:  [phone]  Physical Therapy Treatment  Patient Details  Name: MAJESTIC BRISTER MRN: [id number] Date of Birth: 04-21-1981 Referring Provider (PT): Val Eagle   Encounter Date: 09/17/2021   PT End of Session - 09/17/21 1541     Visit Number 7    Number of Visits 17    Date for PT Re-Evaluation 10/03/21    Authorization Type med pay/wellcare medicaid. 8 visit 9/7-10/5/22    Authorization - Visit Number 6    Authorization - Number of Visits 8    PT Start Time 9675   pt arrived late   PT Stop Time 1610    PT Time Calculation (min) 32 min    Equipment Utilized During Treatment Gait belt    Activity Tolerance Patient tolerated treatment well    Behavior During Therapy WFL for tasks assessed/performed             Past Medical History:  Diagnosis Date   Chronic headache    Low back pain    Morbid obesity ([place])     History reviewed. No pertinent surgical history.  There were no vitals filed for this visit.   Subjective Assessment - 09/17/21 1542     Subjective Pt a few minutes late as has been working all day painting at a house. He is climbing ladders but not locking leg.    Patient Stated Goals Pt wants to be able to walk without cane.    Currently in Pain? No/denies                               [facility] Adult PT Treatment/Exercise - 09/17/21 1544       Ambulation/Gait   Ambulation/Gait Yes    Ambulation/Gait Assistance 5: Supervision    Ambulation/Gait Assistance Details Pt able to get much more upright posture with cuing to keep head up. Only one episode of catching left foot during swing outside but able to correct on own.    Ambulation Distance (Feet) 500 Feet   230' with cane with quad tip   Assistive device Small based quad cane;Prosthesis    Gait Pattern Step-through pattern    Ambulation  Surface Level;Unlevel;Indoor;Outdoor;Paved    Gait Comments Pt performed ladder negotiation with locking out prosthesis 3 step x 2 supervision. Pt was cued to be sure to get center of foot on ladder rung. Pt reported feeling much better with knee locked out.      Neuro Re-ed    Neuro Re-ed Details  In // bars: standing on large rockerboard playing catch with 2.2# med ball with tech with PT guarding x 1 min with board ant/post and 1 min with board lateral.      Prosthetics   Prosthetic Care Comments  PT discussed locking out knee when performing ladder for safety. Pt able to demonstrate ability to lock out pin on back of prosthesis. Also discussed bringing socks with him if he finds that leg is turning in some later in day. May need to add a sock to tighten up fit.    Current prosthetic wear tolerance (days/week)  daily    Current prosthetic wear tolerance (#hours/day)  all awake hours    Residual limb condition  pt reports no sore on leg. Does get some redness at times but removing to dry a couple times a day.  Education Provided Correct ply sock adjustment;Other (comment)    Person(s) Educated Patient    Education Method Explanation;Demonstration    Education Method Verbalized understanding    Donning Prosthesis Modified independent (device/increased time)    Doffing Prosthesis Modified independent (device/increased time)                     PT Education - 09/17/21 1621     Education Details PT discussed requesting more medicaid visits.    Person(s) Educated Patient    Methods Explanation    Comprehension Verbalized understanding              PT Short Term Goals - 09/05/21 1932       PT SHORT TERM GOAL #1   Title Pt will be independent with prosthetic management for improved function.    Baseline 09/03/21: met with familiar concepts. will need assist with unfamiliar concepts    Status Achieved    Target Date 09/04/21      PT SHORT TERM GOAL #2   Title Pt will  be able to tolerate wearing prosthesis all awake hours with no skin issues for improved function.    Baseline 09/04/21 wear time met, pt has heat rash that is clearing up.    Status Partially Met      PT SHORT TERM GOAL #3   Title Pt will ambulate >500' with quad cane on varied surfaces supervision for improved short community distances.    Baseline 08/06/21 75' with SBQC close supervision/CGA. 09/05/21 575' on level surfaces supervision    Time 4    Period Weeks    Status Partially Met    Target Date 09/04/21      PT SHORT TERM GOAL #4   Title Pt will decrease TUG from 23.16 sec to <20 sec for improved balance and functional mobility.    Baseline 08/06/21  TUG=23.16 sec with quad cane. 09/05/21 17.59 sec    Time 4    Period Weeks    Status Achieved    Target Date 09/04/21      PT SHORT TERM GOAL #5   Title Berg Balance and DGI will be performed and LTGs written.    Baseline not completed at eval. 08/22/21 both completed    Time 4    Period Weeks    Status Achieved    Target Date 09/04/21               PT Long Term Goals - 08/23/21 1218       PT LONG TERM GOAL #1   Title Pt will increase gait speed from 0.33ms to >0.832m for improved community mobility.    Baseline 08/06/21 0.5254m   Time 8    Period Weeks    Status New      PT LONG TERM GOAL #2   Title Pt will ambulate >800' on varied surfaces with no AD versus cane independently for improved community mobility.    Baseline 08/06/21 75' with SBQC supervision/CGA    Time 8    Period Weeks    Status New      PT LONG TERM GOAL #3   Title Pt will ambulate up/down ramp and curb independently for improved community access.    Baseline no performing    Time 8    Period Weeks    Status New      PT LONG TERM GOAL #4   Title Pt will ambulate up/down 8 steps mod I with cane  for improved community access.    Baseline not assessed at eval    Time 8    Period Weeks    Status New      PT LONG TERM GOAL #5   Title Pt will  increase Berg from 46/56 to 50/56 for improved balance and decreased fall rik.    Baseline 08/22/21 46/56    Time 8    Period Weeks    Status New      PT LONG TERM GOAL #6   Title Pt will increase DGI from 14/24 to 19/24 for improved balance and gait safety.    Baseline 08/22/21 14/24    Time 8    Period Weeks    Status New                   Plan - 09/17/21 1623     Clinical Impression Statement Pt continues to show improved stability and gait quality with gait on varied surfaces. Progressing to cane with quad tip. Pt showing less circumduction since changes made by prosthetist. Pt did need instruction to lock out prosthesis prior to trying to negotiate a ladder. Pt continues to benefit from skilled PT to further progress gait with prosthesis.    Examination-Activity Limitations Locomotion Level;Transfers;Stairs;Stand    Examination-Participation Restrictions Community Activity;Driving;Occupation;Yard Work    [employer] officer Evolving/Moderate complexity    Rehab Potential Good    PT Frequency 2x / week   plus eval   PT Duration 8 weeks    PT Treatment/Interventions ADLs/Self Care Home Management;DME Instruction;Gait training;Stair training;Functional mobility training;Therapeutic activities;Therapeutic exercise;Balance training;Neuromuscular re-education;Manual techniques;Passive range of motion;Prosthetic Training;Vestibular;Patient/family education    PT Next Visit Plan Let pt know plan to decrease frequency to 1x/week for next 4 weeks. Gait training with cane with rubber quad tip . Activities to promote more left weight shift. Balance training  and hip extensor, abductor strengthening.    Consulted and Agree with Plan of Care Patient             Patient will benefit from skilled therapeutic intervention in order to improve the following deficits and impairments:  Decreased balance, Decreased endurance, Decreased range of motion, Impaired flexibility,  Decreased strength, Decreased mobility, Decreased knowledge of use of DME, Prosthetic Dependency  Visit Diagnosis: Other abnormalities of gait and mobility  Muscle weakness (generalized)  Unsteadiness on feet  Abnormal posture     Problem List Patient Active Problem List   Diagnosis Date Noted   Tibia fracture 02/07/2021   Morbid obesity ([employer])     Electa Sniff, PT, DPT, NCS 09/17/2021, 4:25 PM  [facility] [address] [place], [place], [address] Phone: [phone]   Fax:  [phone]  Name: JERMEL ARTLEY MRN: [id number] Date of Birth: [birth year]  [facility] Authorization   Choose one: Neuro Rehabilitative  Standardized Assessment or Functional Outcome Tool: See Merrilee Jansky, DGI, TUG and Gait Velocity   Score or Percent Disability: Berg=46/56, DGI=14/24, TUG=17.59 sec, gait speed=0.19ms   Body Parts Treated (Select each separately):  Other left leg AKA . Overall deficits/functional limitations for body part selected: moderate

## 2021-09-19 ENCOUNTER — Other Ambulatory Visit: Payer: Self-pay

## 2021-09-19 ENCOUNTER — Ambulatory Visit: Payer: Medicaid Other

## 2021-09-19 DIAGNOSIS — R2689 Other abnormalities of gait and mobility: Secondary | ICD-10-CM

## 2021-09-19 DIAGNOSIS — R2681 Unsteadiness on feet: Secondary | ICD-10-CM

## 2021-09-19 DIAGNOSIS — M6281 Muscle weakness (generalized): Secondary | ICD-10-CM | POA: Diagnosis not present

## 2021-09-19 DIAGNOSIS — R293 Abnormal posture: Secondary | ICD-10-CM | POA: Diagnosis not present

## 2021-09-20 NOTE — Therapy (Signed)
[facility] [address] [place], [place], [address] Phone: [phone]   Fax:  [phone]  Physical Therapy Treatment  Patient Details  Name: Cory [name] MRN: [id number] Date of Birth: 1981/06/25 Referring Provider (PT): Val Eagle   Encounter Date: 09/19/2021   PT End of Session - 09/19/21 1551     Visit Number 8    Number of Visits 17    Date for PT Re-Evaluation 10/03/21    Authorization Type med pay/wellcare medicaid. 8 visit 9/7-10/5/22    Authorization - Visit Number 6    Authorization - Number of Visits 8    PT Start Time 6294   Pt arrived late   PT Stop Time 7654    PT Time Calculation (min) 24 min    Equipment Utilized During Treatment Gait belt    Activity Tolerance Patient tolerated treatment well    Behavior During Therapy WFL for tasks assessed/performed             Past Medical History:  Diagnosis Date   Chronic headache    Low back pain    Morbid obesity ([place])     History reviewed. No pertinent surgical history.  There were no vitals filed for this visit.   Subjective Assessment - 09/19/21 1552     Subjective Pt reports doing good. He did not work today with cousin as was beat after yesterday.    Patient Stated Goals Pt wants to be able to walk without cane.    Currently in Pain? No/denies                               [facility] Adult PT Treatment/Exercise - 09/19/21 1552       Ambulation/Gait   Ambulation/Gait Yes    Ambulation/Gait Assistance 5: Supervision    Ambulation/Gait Assistance Details around in clinic between activities    Assistive device Small based quad cane;Prosthesis    Gait Pattern Step-through pattern;Decreased weight shift to left    Ambulation Surface Level;Indoor      Neuro Re-ed    Neuro Re-ed Details  In // bars: walking with 1UE support over 2x4" bolsters x 4 bouts. Pt unable to get knee flexion of prosthesis to clear so  switched to 4 yard sticks x 4 bouts, side stepping over yard sticks x 4 bouts. Walking over blue mat with 1 UE support 8' x 6. Staggered stance x 30 sec each position. Standing with RLE on 6" step trying to maintain upright posture with visual and verbal cues for upright posture with trying to push left pelvis forward 10 sec x 4. Pt reported feeling strong stretch in left hip.      Prosthetics   Prosthetic Care Comments  Pt denies wearing any socks today but reports he did add the other day as prosthesis was moving some as day went on.    Current prosthetic wear tolerance (days/week)  daily    Current prosthetic wear tolerance (#hours/day)  all awake hours    Education Provided Correct ply sock adjustment    Person(s) Educated Patient    Education Method Explanation                     PT Education - 09/20/21 1324     Education Details Discussed plan to decrease frequency to 1x/week    Person(s) Educated Patient    Methods Explanation    Comprehension  Verbalized understanding              PT Short Term Goals - 09/05/21 1932       PT SHORT TERM GOAL #1   Title Pt will be independent with prosthetic management for improved function.    Baseline 09/03/21: met with familiar concepts. will need assist with unfamiliar concepts    Status Achieved    Target Date 09/04/21      PT SHORT TERM GOAL #2   Title Pt will be able to tolerate wearing prosthesis all awake hours with no skin issues for improved function.    Baseline 09/04/21 wear time met, pt has heat rash that is clearing up.    Status Partially Met      PT SHORT TERM GOAL #3   Title Pt will ambulate >500' with quad cane on varied surfaces supervision for improved short community distances.    Baseline 08/06/21 75' with SBQC close supervision/CGA. 09/05/21 575' on level surfaces supervision    Time 4    Period Weeks    Status Partially Met    Target Date 09/04/21      PT SHORT TERM GOAL #4   Title Pt will decrease  TUG from 23.16 sec to <20 sec for improved balance and functional mobility.    Baseline 08/06/21  TUG=23.16 sec with quad cane. 09/05/21 17.59 sec    Time 4    Period Weeks    Status Achieved    Target Date 09/04/21      PT SHORT TERM GOAL #5   Title Berg Balance and DGI will be performed and LTGs written.    Baseline not completed at eval. 08/22/21 both completed    Time 4    Period Weeks    Status Achieved    Target Date 09/04/21               PT Long Term Goals - 08/23/21 1218       PT LONG TERM GOAL #1   Title Pt will increase gait speed from 0.43ms to >0.858m for improved community mobility.    Baseline 08/06/21 0.5219m   Time 8    Period Weeks    Status New      PT LONG TERM GOAL #2   Title Pt will ambulate >800' on varied surfaces with no AD versus cane independently for improved community mobility.    Baseline 08/06/21 75' with SBQC supervision/CGA    Time 8    Period Weeks    Status New      PT LONG TERM GOAL #3   Title Pt will ambulate up/down ramp and curb independently for improved community access.    Baseline no performing    Time 8    Period Weeks    Status New      PT LONG TERM GOAL #4   Title Pt will ambulate up/down 8 steps mod I with cane for improved community access.    Baseline not assessed at eval    Time 8    Period Weeks    Status New      PT LONG TERM GOAL #5   Title Pt will increase Berg from 46/56 to 50/56 for improved balance and decreased fall rik.    Baseline 08/22/21 46/56    Time 8    Period Weeks    Status New      PT LONG TERM GOAL #6   Title Pt will increase DGI from 14/24 to  19/24 for improved balance and gait safety.    Baseline 08/22/21 14/24    Time 8    Period Weeks    Status New                   Plan - 09/20/21 1324     Clinical Impression Statement PT continued to work on gait with decreasing UE support over obstacles and on compliant surfaces. Also activities in increase left weight shift while  working on upright posture. Pt continues to show improvements in all areas.    Examination-Activity Limitations Locomotion Level;Transfers;Stairs;Stand    Examination-Participation Restrictions Community Activity;Driving;Occupation;Yard Work    [employer] officer Evolving/Moderate complexity    Rehab Potential Good    PT Frequency 2x / week   plus eval   PT Duration 8 weeks    PT Treatment/Interventions ADLs/Self Care Home Management;DME Instruction;Gait training;Stair training;Functional mobility training;Therapeutic activities;Therapeutic exercise;Balance training;Neuromuscular re-education;Manual techniques;Passive range of motion;Prosthetic Training;Vestibular;Patient/family education    PT Next Visit Plan Need to add 1 more week on. Did we get medicaid auth back? Gait training with cane with rubber quad tip . Activities to promote more left weight shift. Balance training  and hip extensor, abductor strengthening.    Consulted and Agree with Plan of Care Patient             Patient will benefit from skilled therapeutic intervention in order to improve the following deficits and impairments:  Decreased balance, Decreased endurance, Decreased range of motion, Impaired flexibility, Decreased strength, Decreased mobility, Decreased knowledge of use of DME, Prosthetic Dependency  Visit Diagnosis: Other abnormalities of gait and mobility  Muscle weakness (generalized)  Unsteadiness on feet     Problem List Patient Active Problem List   Diagnosis Date Noted   Tibia fracture 02/07/2021   Morbid obesity ([place])     Electa Sniff, PT, DPT, NCS 09/20/2021, 1:28 PM  [facility] [address] [place], [place], [address] Phone: [phone]   Fax:  [phone]  Name: Cory [name] MRN: [id number] Date of Birth: 04-21-1981

## 2021-09-24 ENCOUNTER — Ambulatory Visit: Payer: Medicaid Other

## 2021-10-01 ENCOUNTER — Ambulatory Visit: Payer: Medicaid Other | Admitting: Physical Therapy

## 2021-10-03 ENCOUNTER — Encounter: Payer: Medicaid Other | Admitting: Physical Therapy

## 2021-10-08 ENCOUNTER — Telehealth: Payer: Self-pay

## 2021-10-08 ENCOUNTER — Ambulatory Visit: Payer: Medicaid Other

## 2021-10-08 NOTE — Telephone Encounter (Signed)
PT called pt as this was 3rd no show and last visit on schedule. Do have more medicaid auth. Spoke with pt who apologized but he was on the way to hospital as son got injured in football. He will call back to get back on schedule.  Cory [name], PT, DPT, NCS

## 2021-10-14 DIAGNOSIS — Z419 Encounter for procedure for purposes other than remedying health state, unspecified: Secondary | ICD-10-CM | POA: Diagnosis not present

## 2021-11-13 DIAGNOSIS — Z419 Encounter for procedure for purposes other than remedying health state, unspecified: Secondary | ICD-10-CM | POA: Diagnosis not present

## 2021-12-14 DIAGNOSIS — Z419 Encounter for procedure for purposes other than remedying health state, unspecified: Secondary | ICD-10-CM | POA: Diagnosis not present

## 2022-01-14 DIAGNOSIS — Z419 Encounter for procedure for purposes other than remedying health state, unspecified: Secondary | ICD-10-CM | POA: Diagnosis not present

## 2022-01-19 DIAGNOSIS — Z5181 Encounter for therapeutic drug level monitoring: Secondary | ICD-10-CM | POA: Diagnosis not present

## 2022-01-19 DIAGNOSIS — Z79899 Other long term (current) drug therapy: Secondary | ICD-10-CM | POA: Diagnosis not present

## 2022-01-19 DIAGNOSIS — Z13228 Encounter for screening for other metabolic disorders: Secondary | ICD-10-CM | POA: Diagnosis not present

## 2022-01-19 DIAGNOSIS — Z139 Encounter for screening, unspecified: Secondary | ICD-10-CM | POA: Diagnosis not present

## 2022-01-19 DIAGNOSIS — Z131 Encounter for screening for diabetes mellitus: Secondary | ICD-10-CM | POA: Diagnosis not present

## 2022-01-19 DIAGNOSIS — D649 Anemia, unspecified: Secondary | ICD-10-CM | POA: Diagnosis not present

## 2022-02-04 DIAGNOSIS — M549 Dorsalgia, unspecified: Secondary | ICD-10-CM | POA: Diagnosis not present

## 2022-02-04 DIAGNOSIS — M129 Arthropathy, unspecified: Secondary | ICD-10-CM | POA: Diagnosis not present

## 2022-02-04 DIAGNOSIS — Z131 Encounter for screening for diabetes mellitus: Secondary | ICD-10-CM | POA: Diagnosis not present

## 2022-02-04 DIAGNOSIS — Z6839 Body mass index (BMI) 39.0-39.9, adult: Secondary | ICD-10-CM | POA: Diagnosis not present

## 2022-02-04 DIAGNOSIS — M542 Cervicalgia: Secondary | ICD-10-CM | POA: Diagnosis not present

## 2022-02-04 DIAGNOSIS — M545 Low back pain, unspecified: Secondary | ICD-10-CM | POA: Diagnosis not present

## 2022-02-04 DIAGNOSIS — Z79899 Other long term (current) drug therapy: Secondary | ICD-10-CM | POA: Diagnosis not present

## 2022-02-04 DIAGNOSIS — M546 Pain in thoracic spine: Secondary | ICD-10-CM | POA: Diagnosis not present

## 2022-02-04 DIAGNOSIS — M5134 Other intervertebral disc degeneration, thoracic region: Secondary | ICD-10-CM | POA: Diagnosis not present

## 2022-02-04 DIAGNOSIS — F17210 Nicotine dependence, cigarettes, uncomplicated: Secondary | ICD-10-CM | POA: Diagnosis not present

## 2022-02-04 DIAGNOSIS — E559 Vitamin D deficiency, unspecified: Secondary | ICD-10-CM | POA: Diagnosis not present

## 2022-02-04 DIAGNOSIS — Z9181 History of falling: Secondary | ICD-10-CM | POA: Diagnosis not present

## 2022-02-11 DIAGNOSIS — Z419 Encounter for procedure for purposes other than remedying health state, unspecified: Secondary | ICD-10-CM | POA: Diagnosis not present

## 2022-02-18 DIAGNOSIS — E559 Vitamin D deficiency, unspecified: Secondary | ICD-10-CM | POA: Diagnosis not present

## 2022-02-18 DIAGNOSIS — F4310 Post-traumatic stress disorder, unspecified: Secondary | ICD-10-CM | POA: Diagnosis not present

## 2022-02-18 DIAGNOSIS — Z79899 Other long term (current) drug therapy: Secondary | ICD-10-CM | POA: Diagnosis not present

## 2022-02-18 DIAGNOSIS — Z9181 History of falling: Secondary | ICD-10-CM | POA: Diagnosis not present

## 2022-02-18 DIAGNOSIS — M549 Dorsalgia, unspecified: Secondary | ICD-10-CM | POA: Diagnosis not present

## 2022-02-18 DIAGNOSIS — F17210 Nicotine dependence, cigarettes, uncomplicated: Secondary | ICD-10-CM | POA: Diagnosis not present

## 2022-02-18 DIAGNOSIS — M5134 Other intervertebral disc degeneration, thoracic region: Secondary | ICD-10-CM | POA: Diagnosis not present

## 2022-02-18 DIAGNOSIS — Z87891 Personal history of nicotine dependence: Secondary | ICD-10-CM | POA: Diagnosis not present

## 2022-03-14 DIAGNOSIS — Z419 Encounter for procedure for purposes other than remedying health state, unspecified: Secondary | ICD-10-CM | POA: Diagnosis not present

## 2022-03-26 DIAGNOSIS — Z9181 History of falling: Secondary | ICD-10-CM | POA: Diagnosis not present

## 2022-03-26 DIAGNOSIS — M549 Dorsalgia, unspecified: Secondary | ICD-10-CM | POA: Diagnosis not present

## 2022-03-26 DIAGNOSIS — Z79899 Other long term (current) drug therapy: Secondary | ICD-10-CM | POA: Diagnosis not present

## 2022-03-26 DIAGNOSIS — F17210 Nicotine dependence, cigarettes, uncomplicated: Secondary | ICD-10-CM | POA: Diagnosis not present

## 2022-03-26 DIAGNOSIS — M5134 Other intervertebral disc degeneration, thoracic region: Secondary | ICD-10-CM | POA: Diagnosis not present

## 2022-03-26 DIAGNOSIS — Z87891 Personal history of nicotine dependence: Secondary | ICD-10-CM | POA: Diagnosis not present

## 2022-03-26 DIAGNOSIS — Z6838 Body mass index (BMI) 38.0-38.9, adult: Secondary | ICD-10-CM | POA: Diagnosis not present

## 2022-04-01 DIAGNOSIS — Z79899 Other long term (current) drug therapy: Secondary | ICD-10-CM | POA: Diagnosis not present

## 2022-04-13 DIAGNOSIS — Z419 Encounter for procedure for purposes other than remedying health state, unspecified: Secondary | ICD-10-CM | POA: Diagnosis not present

## 2022-04-21 DIAGNOSIS — R892 Abnormal level of other drugs, medicaments and biological substances in specimens from other organs, systems and tissues: Secondary | ICD-10-CM | POA: Diagnosis not present

## 2022-04-21 DIAGNOSIS — G8929 Other chronic pain: Secondary | ICD-10-CM | POA: Diagnosis not present

## 2022-04-21 DIAGNOSIS — Z6837 Body mass index (BMI) 37.0-37.9, adult: Secondary | ICD-10-CM | POA: Diagnosis not present

## 2022-04-21 DIAGNOSIS — M545 Low back pain, unspecified: Secondary | ICD-10-CM | POA: Diagnosis not present

## 2022-04-21 DIAGNOSIS — Z79899 Other long term (current) drug therapy: Secondary | ICD-10-CM | POA: Diagnosis not present

## 2022-05-14 DIAGNOSIS — Z419 Encounter for procedure for purposes other than remedying health state, unspecified: Secondary | ICD-10-CM | POA: Diagnosis not present

## 2022-07-14 DIAGNOSIS — Z419 Encounter for procedure for purposes other than remedying health state, unspecified: Secondary | ICD-10-CM | POA: Diagnosis not present

## 2022-08-14 DIAGNOSIS — Z419 Encounter for procedure for purposes other than remedying health state, unspecified: Secondary | ICD-10-CM | POA: Diagnosis not present

## 2022-09-03 DIAGNOSIS — M47812 Spondylosis without myelopathy or radiculopathy, cervical region: Secondary | ICD-10-CM | POA: Diagnosis not present

## 2022-09-03 DIAGNOSIS — M545 Low back pain, unspecified: Secondary | ICD-10-CM | POA: Diagnosis not present

## 2022-09-03 DIAGNOSIS — M7918 Myalgia, other site: Secondary | ICD-10-CM | POA: Diagnosis not present

## 2022-09-03 DIAGNOSIS — R202 Paresthesia of skin: Secondary | ICD-10-CM | POA: Diagnosis not present

## 2022-09-03 DIAGNOSIS — G8929 Other chronic pain: Secondary | ICD-10-CM | POA: Diagnosis not present

## 2022-09-13 DIAGNOSIS — Z419 Encounter for procedure for purposes other than remedying health state, unspecified: Secondary | ICD-10-CM | POA: Diagnosis not present

## 2022-10-14 DIAGNOSIS — Z419 Encounter for procedure for purposes other than remedying health state, unspecified: Secondary | ICD-10-CM | POA: Diagnosis not present

## 2022-10-21 DIAGNOSIS — Z1389 Encounter for screening for other disorder: Secondary | ICD-10-CM | POA: Diagnosis not present

## 2022-10-21 DIAGNOSIS — F329 Major depressive disorder, single episode, unspecified: Secondary | ICD-10-CM | POA: Diagnosis not present

## 2022-10-21 DIAGNOSIS — E559 Vitamin D deficiency, unspecified: Secondary | ICD-10-CM | POA: Diagnosis not present

## 2022-10-21 DIAGNOSIS — Z5181 Encounter for therapeutic drug level monitoring: Secondary | ICD-10-CM | POA: Diagnosis not present

## 2022-11-11 ENCOUNTER — Ambulatory Visit: Payer: Medicaid Other | Admitting: Dermatology

## 2022-11-13 DIAGNOSIS — Z419 Encounter for procedure for purposes other than remedying health state, unspecified: Secondary | ICD-10-CM | POA: Diagnosis not present

## 2022-11-27 ENCOUNTER — Telehealth: Payer: Self-pay

## 2022-11-27 NOTE — Telephone Encounter (Signed)
..   Medicaid Managed Care Note  11/27/2022 Name: Cory [name] MRN: [id number] DOB: [birth year]  Cory [name] is a 41 y.o. year old male who is a primary care patient of Patient, No Pcp Per and is actively engaged with the care management team. I reached out to Cory [name] by phone today to assist with scheduling an initial visit with the RN Case Manager  Follow up plan: Patient declines further follow up and engagement by the care management team. Appropriate care team members and provider have been notified via electronic communication.  Weston Settle Care Guide, High Risk Medicaid Managed Care Embedded Care Coordination [facility]  [facility]

## 2022-12-14 DIAGNOSIS — Z419 Encounter for procedure for purposes other than remedying health state, unspecified: Secondary | ICD-10-CM | POA: Diagnosis not present

## 2023-01-14 DIAGNOSIS — Z419 Encounter for procedure for purposes other than remedying health state, unspecified: Secondary | ICD-10-CM | POA: Diagnosis not present

## 2023-02-12 DIAGNOSIS — Z419 Encounter for procedure for purposes other than remedying health state, unspecified: Secondary | ICD-10-CM | POA: Diagnosis not present

## 2023-02-17 ENCOUNTER — Ambulatory Visit: Payer: Medicaid Other | Admitting: Dermatology

## 2023-02-18 DIAGNOSIS — F4320 Adjustment disorder, unspecified: Secondary | ICD-10-CM | POA: Diagnosis not present

## 2023-02-18 DIAGNOSIS — F1910 Other psychoactive substance abuse, uncomplicated: Secondary | ICD-10-CM | POA: Diagnosis not present

## 2023-03-15 DIAGNOSIS — Z419 Encounter for procedure for purposes other than remedying health state, unspecified: Secondary | ICD-10-CM | POA: Diagnosis not present

## 2023-04-14 DIAGNOSIS — Z419 Encounter for procedure for purposes other than remedying health state, unspecified: Secondary | ICD-10-CM | POA: Diagnosis not present

## 2023-05-15 DIAGNOSIS — Z419 Encounter for procedure for purposes other than remedying health state, unspecified: Secondary | ICD-10-CM | POA: Diagnosis not present

## 2023-05-21 DIAGNOSIS — F1910 Other psychoactive substance abuse, uncomplicated: Secondary | ICD-10-CM | POA: Diagnosis not present

## 2023-06-14 DIAGNOSIS — Z419 Encounter for procedure for purposes other than remedying health state, unspecified: Secondary | ICD-10-CM | POA: Diagnosis not present

## 2023-07-15 DIAGNOSIS — Z419 Encounter for procedure for purposes other than remedying health state, unspecified: Secondary | ICD-10-CM | POA: Diagnosis not present

## 2023-08-30 ENCOUNTER — Telehealth: Payer: Self-pay

## 2023-08-30 NOTE — Telephone Encounter (Signed)
LVM for patient to call back [phone], or to call PCP office to schedule follow up apt. AS, CMA  

## 2023-09-15 ENCOUNTER — Ambulatory Visit: Payer: 59 | Admitting: Physician Assistant
